# Patient Record
Sex: Male | Born: 1945 | Race: White | Hispanic: No | Marital: Married | Smoking: Former smoker
Health system: Southern US, Community
[De-identification: ages and names within clinical notes are randomized; demographics above are authoritative.]

## PROBLEM LIST (undated history)

## (undated) DIAGNOSIS — K5790 Diverticulosis of intestine, part unspecified, without perforation or abscess without bleeding: Secondary | ICD-10-CM

## (undated) DIAGNOSIS — E785 Hyperlipidemia, unspecified: Secondary | ICD-10-CM

## (undated) DIAGNOSIS — I714 Abdominal aortic aneurysm, without rupture, unspecified: Secondary | ICD-10-CM

## (undated) DIAGNOSIS — Z86010 Personal history of colonic polyps: Principal | ICD-10-CM

## (undated) DIAGNOSIS — N189 Chronic kidney disease, unspecified: Secondary | ICD-10-CM

## (undated) DIAGNOSIS — T7840XA Allergy, unspecified, initial encounter: Secondary | ICD-10-CM

## (undated) DIAGNOSIS — I10 Essential (primary) hypertension: Secondary | ICD-10-CM

## (undated) DIAGNOSIS — M1990 Unspecified osteoarthritis, unspecified site: Secondary | ICD-10-CM

## (undated) DIAGNOSIS — I6529 Occlusion and stenosis of unspecified carotid artery: Secondary | ICD-10-CM

## (undated) HISTORY — DX: Allergy, unspecified, initial encounter: T78.40XA

## (undated) HISTORY — DX: Diverticulosis of intestine, part unspecified, without perforation or abscess without bleeding: K57.90

## (undated) HISTORY — DX: Essential (primary) hypertension: I10

## (undated) HISTORY — DX: Unspecified osteoarthritis, unspecified site: M19.90

## (undated) HISTORY — DX: Personal history of colonic polyps: Z86.010

## (undated) HISTORY — DX: Abdominal aortic aneurysm, without rupture, unspecified: I71.40

## (undated) HISTORY — DX: Chronic kidney disease, unspecified: N18.9

## (undated) HISTORY — DX: Abdominal aortic aneurysm, without rupture: I71.4

## (undated) HISTORY — DX: Occlusion and stenosis of unspecified carotid artery: I65.29

## (undated) HISTORY — DX: Hyperlipidemia, unspecified: E78.5

---
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
## 1999-04-15 HISTORY — PX: CATARACT EXTRACTION: SUR2

## 2000-08-30 ENCOUNTER — Encounter: Payer: Self-pay | Admitting: *Deleted

## 2000-08-30 ENCOUNTER — Emergency Department (HOSPITAL_COMMUNITY): Admission: EM | Admit: 2000-08-30 | Discharge: 2000-08-30 | Payer: Self-pay | Admitting: *Deleted

## 2004-05-28 ENCOUNTER — Ambulatory Visit: Payer: Self-pay | Admitting: Internal Medicine

## 2004-07-19 ENCOUNTER — Ambulatory Visit: Payer: Self-pay | Admitting: Internal Medicine

## 2004-07-30 ENCOUNTER — Ambulatory Visit: Payer: Self-pay | Admitting: Internal Medicine

## 2004-08-16 ENCOUNTER — Ambulatory Visit: Payer: Self-pay | Admitting: Internal Medicine

## 2004-08-28 ENCOUNTER — Ambulatory Visit: Payer: Self-pay | Admitting: Internal Medicine

## 2009-03-13 ENCOUNTER — Emergency Department (HOSPITAL_COMMUNITY): Admission: EM | Admit: 2009-03-13 | Discharge: 2009-03-13 | Payer: Self-pay | Admitting: Emergency Medicine

## 2010-07-17 LAB — DIFFERENTIAL
Basophils Absolute: 0.1 10*3/uL (ref 0.0–0.1)
Basophils Relative: 1 % (ref 0–1)
Eosinophils Absolute: 0.4 10*3/uL (ref 0.0–0.7)
Eosinophils Relative: 3 % (ref 0–5)
Lymphocytes Relative: 21 % (ref 12–46)
Lymphs Abs: 3 10*3/uL (ref 0.7–4.0)
Monocytes Absolute: 1.1 10*3/uL — ABNORMAL HIGH (ref 0.1–1.0)
Monocytes Relative: 8 % (ref 3–12)
Neutro Abs: 9.9 10*3/uL — ABNORMAL HIGH (ref 1.7–7.7)
Neutrophils Relative %: 68 % (ref 43–77)

## 2010-07-17 LAB — BASIC METABOLIC PANEL
BUN: 13 mg/dL (ref 6–23)
CO2: 31 mEq/L (ref 19–32)
Calcium: 9.7 mg/dL (ref 8.4–10.5)
Chloride: 99 mEq/L (ref 96–112)
Creatinine, Ser: 1.09 mg/dL (ref 0.4–1.5)
GFR calc Af Amer: >60 mL/min (ref >60)
GFR calc non Af Amer: >60 mL/min (ref >60)
Glucose, Bld: 106 mg/dL — ABNORMAL HIGH (ref 70–99)
Potassium: 4.3 mEq/L (ref 3.5–5.1)
Sodium: 137 mEq/L (ref 135–145)

## 2010-07-17 LAB — CBC
HCT: 47.2 % (ref 39.0–52.0)
Hemoglobin: 15.9 g/dL (ref 13.0–17.0)
MCHC: 33.8 g/dL (ref 30.0–36.0)
MCV: 91.8 fL (ref 78.0–100.0)
Platelets: 252 10*3/uL (ref 150–400)
RBC: 5.15 MIL/uL (ref 4.22–5.81)
RDW: 13.6 % (ref 11.5–15.5)
WBC: 14.6 10*3/uL — ABNORMAL HIGH (ref 4.0–10.5)

## 2010-07-17 LAB — HEPATIC FUNCTION PANEL
ALT: 15 U/L (ref 0–53)
Alkaline Phosphatase: 48 U/L (ref 39–117)
Bilirubin, Direct: <0.1 mg/dL (ref 0.0–0.3)

## 2011-05-28 ENCOUNTER — Other Ambulatory Visit: Payer: Self-pay | Admitting: Family Medicine

## 2011-05-28 ENCOUNTER — Telehealth: Payer: Self-pay | Admitting: Internal Medicine

## 2011-05-28 DIAGNOSIS — Z136 Encounter for screening for cardiovascular disorders: Secondary | ICD-10-CM

## 2011-05-28 NOTE — Telephone Encounter (Signed)
Transferring GI Care to [facility]

## 2011-06-05 ENCOUNTER — Ambulatory Visit
Admission: RE | Admit: 2011-06-05 | Discharge: 2011-06-05 | Disposition: A | Discharge status: Home / Self Care | Payer: Medicare Other | Source: Ambulatory Visit | Attending: Family Medicine | Admitting: Family Medicine

## 2011-06-05 DIAGNOSIS — Z136 Encounter for screening for cardiovascular disorders: Secondary | ICD-10-CM

## 2012-07-20 ENCOUNTER — Other Ambulatory Visit: Payer: Self-pay | Admitting: Family Medicine

## 2012-07-20 DIAGNOSIS — I714 Abdominal aortic aneurysm, without rupture: Secondary | ICD-10-CM

## 2012-07-29 ENCOUNTER — Ambulatory Visit
Admission: RE | Admit: 2012-07-29 | Discharge: 2012-07-29 | Disposition: A | Discharge status: Home / Self Care | Payer: Medicare Other | Source: Ambulatory Visit | Attending: Family Medicine | Admitting: Family Medicine

## 2012-07-29 DIAGNOSIS — I714 Abdominal aortic aneurysm, without rupture, unspecified: Secondary | ICD-10-CM

## 2014-08-11 ENCOUNTER — Other Ambulatory Visit: Payer: Self-pay | Admitting: Family Medicine

## 2014-08-11 DIAGNOSIS — I714 Abdominal aortic aneurysm, without rupture, unspecified: Secondary | ICD-10-CM

## 2014-10-03 ENCOUNTER — Ambulatory Visit
Admission: RE | Admit: 2014-10-03 | Discharge: 2014-10-03 | Disposition: A | Discharge status: Home / Self Care | Payer: Commercial Managed Care - HMO | Source: Ambulatory Visit | Attending: Family Medicine | Admitting: Family Medicine

## 2014-10-03 DIAGNOSIS — I714 Abdominal aortic aneurysm, without rupture, unspecified: Secondary | ICD-10-CM

## 2014-11-03 ENCOUNTER — Other Ambulatory Visit: Payer: Self-pay | Admitting: Family Medicine

## 2014-11-03 DIAGNOSIS — I714 Abdominal aortic aneurysm, without rupture, unspecified: Secondary | ICD-10-CM

## 2014-11-07 ENCOUNTER — Encounter: Payer: Self-pay | Admitting: Vascular Surgery

## 2014-11-08 ENCOUNTER — Other Ambulatory Visit: Payer: Self-pay | Admitting: Family Medicine

## 2014-11-08 DIAGNOSIS — I714 Abdominal aortic aneurysm, without rupture, unspecified: Secondary | ICD-10-CM

## 2014-12-01 ENCOUNTER — Encounter: Payer: Self-pay | Admitting: Vascular Surgery

## 2014-12-04 ENCOUNTER — Encounter: Payer: Self-pay | Admitting: Vascular Surgery

## 2014-12-05 ENCOUNTER — Encounter: Payer: Self-pay | Admitting: Vascular Surgery

## 2014-12-05 ENCOUNTER — Ambulatory Visit (INDEPENDENT_AMBULATORY_CARE_PROVIDER_SITE_OTHER): Payer: Commercial Managed Care - HMO | Admitting: Vascular Surgery

## 2014-12-05 ENCOUNTER — Encounter (INDEPENDENT_AMBULATORY_CARE_PROVIDER_SITE_OTHER): Payer: Self-pay

## 2014-12-05 VITALS — BP 138/82 | HR 71 | Temp 97.1°F | Resp 16 | Ht 67.0 in | Wt 195.0 lb

## 2014-12-05 DIAGNOSIS — I714 Abdominal aortic aneurysm, without rupture, unspecified: Secondary | ICD-10-CM

## 2014-12-05 NOTE — Progress Notes (Signed)
Subjective:     Patient ID: Adam Shepherd, male   DOB: 1945/08/15, 69 y.o.   MRN: [id number]  HPI this 69 year old male was referred for evaluation of small abdominal aortic aneurysm. In 2012 the aneurysm measured 3.2 cm, in 2014 it was 3.5 cm, and this year 3.7 x 3.5 cm on ultrasound study. He also had a CT scan performed in 2010 at that time the aorta measured 2.7 cm in maximum diameter. He denies any new abdominal or back symptoms. He does have a history of diverticulitis which cause some left lower quadrant discomfort which resolved. He does not smoke and has no known family history of aortic aneurysm because he is an orphan.  Past Medical History  Diagnosis Date  . Hypertension   . Hyperlipidemia   . Allergy   . Diverticulosis   . Chronic kidney disease   . Degenerative joint disease   . AAA (abdominal aortic aneurysm)     Social History  Substance Use Topics  . Smoking status: Former Smoker    Quit date: 11/07/1966  . Smokeless tobacco: Never Used  . Alcohol Use: No    No family history on file.  No Known Allergies   Current outpatient prescriptions:  .  aspirin EC 81 MG tablet, Take 81 mg by mouth daily., Disp: , Rfl:  .  atenolol (TENORMIN) 25 MG tablet, Take 25 mg by mouth daily., Disp: , Rfl:  .  fluticasone (FLONASE) 50 MCG/ACT nasal spray, Place 2 sprays into both nostrils daily as needed for allergies or rhinitis., Disp: , Rfl:   Filed Vitals:   12/05/14 1042  BP: 138/82  Pulse: 71  Temp: 97.1 F (36.2 C)  Resp: 16  Height: 5\' 7"  (1.702 m)  Weight: 195 lb (88.451 kg)  SpO2: 97%    Body mass index is 30.53 kg/(m^2).           Review of Systems denies chest pain, dyspnea on exertion, PND, orthopnea, hemoptysis, claudication. Does have history of degenerative joint disease, diverticulitis. Other systems negative and complete review of systems     Objective:   Physical Exam BP 138/82 mmHg  Pulse 71  Temp(Src) 97.1 F (36.2 C)  Resp 16  Ht 5'  7" (1.702 m)  Wt 195 lb (88.451 kg)  BMI 30.53 kg/m2  SpO2 97%  Gen.-alert and oriented x3 in no apparent distress HEENT normal for age Lungs no rhonchi or wheezing Cardiovascular regular rhythm no murmurs carotid pulses 3+ palpable no bruits audible Abdomen soft nontender no palpable masses Musculoskeletal free of  major deformities Skin clear -no rashes Neurologic normal Lower extremities 3+ femoral and dorsalis pedis pulses palpable bilaterally with no edema  Today I reviewed the ultrasound reports from the past 3 studies in 20 04/02/2013 and 2016 and also viewed the images by computer of the CT scan performed in 2010. As summarized above the aneurysm has grown from 2.7 cm in 2010 by CT scan to its current size of 3.7 x 3.5 by ultrasound. It is asymptomatic.       Assessment:     Abdominal aortic aneurysm-3.5 x 3.7 cm in maximum diameter by ultrasound in June 2016 Degenerative joint disease History of diverticulitis    Plan:     Discussed necessary follow-up with patient and his wife and the fact that no treatment would be recommended until aneurysm exceeds 5 cm in maximum diameter Patient return in 2 years with duplex scan of the aorta and our office and see  nurse practitioner for continued surveillance Patient instructed if he develops abdominal or left flank discomfort she go to the emergency department at which time CT scan could rule out leakage of the aneurysm

## 2014-12-05 NOTE — Addendum Note (Signed)
Addended by: Dorthula Rue L on: 12/05/2014 02:45 PM   Modules accepted: Orders

## 2014-12-06 NOTE — Addendum Note (Signed)
Addended by: Dorthula Rue L on: 12/06/2014 09:56 AM   Modules accepted: Orders

## 2014-12-08 ENCOUNTER — Encounter: Payer: Self-pay | Admitting: Vascular Surgery

## 2015-02-19 ENCOUNTER — Other Ambulatory Visit: Payer: Self-pay | Admitting: Family Medicine

## 2015-02-19 ENCOUNTER — Ambulatory Visit
Admission: RE | Admit: 2015-02-19 | Discharge: 2015-02-19 | Disposition: A | Discharge status: Home / Self Care | Payer: Commercial Managed Care - HMO | Source: Ambulatory Visit | Attending: Family Medicine | Admitting: Family Medicine

## 2015-02-19 DIAGNOSIS — M79606 Pain in leg, unspecified: Secondary | ICD-10-CM

## 2015-08-14 ENCOUNTER — Encounter: Payer: Self-pay | Admitting: Internal Medicine

## 2015-08-14 DIAGNOSIS — I10 Essential (primary) hypertension: Secondary | ICD-10-CM | POA: Diagnosis not present

## 2015-08-14 DIAGNOSIS — Z0000 Encounter for general adult medical examination without abnormal findings: Secondary | ICD-10-CM | POA: Diagnosis not present

## 2015-08-14 DIAGNOSIS — S32020A Wedge compression fracture of second lumbar vertebra, initial encounter for closed fracture: Secondary | ICD-10-CM | POA: Diagnosis not present

## 2015-08-14 DIAGNOSIS — E785 Hyperlipidemia, unspecified: Secondary | ICD-10-CM | POA: Diagnosis not present

## 2015-08-14 DIAGNOSIS — I714 Abdominal aortic aneurysm, without rupture: Secondary | ICD-10-CM | POA: Diagnosis not present

## 2015-08-14 DIAGNOSIS — Z125 Encounter for screening for malignant neoplasm of prostate: Secondary | ICD-10-CM | POA: Diagnosis not present

## 2015-08-14 DIAGNOSIS — Z79899 Other long term (current) drug therapy: Secondary | ICD-10-CM | POA: Diagnosis not present

## 2015-09-11 DIAGNOSIS — M955 Acquired deformity of pelvis: Secondary | ICD-10-CM | POA: Diagnosis not present

## 2015-09-11 DIAGNOSIS — M9905 Segmental and somatic dysfunction of pelvic region: Secondary | ICD-10-CM | POA: Diagnosis not present

## 2015-09-11 DIAGNOSIS — M62830 Muscle spasm of back: Secondary | ICD-10-CM | POA: Diagnosis not present

## 2015-09-11 DIAGNOSIS — M9903 Segmental and somatic dysfunction of lumbar region: Secondary | ICD-10-CM | POA: Diagnosis not present

## 2015-09-14 DIAGNOSIS — M9905 Segmental and somatic dysfunction of pelvic region: Secondary | ICD-10-CM | POA: Diagnosis not present

## 2015-09-14 DIAGNOSIS — M62830 Muscle spasm of back: Secondary | ICD-10-CM | POA: Diagnosis not present

## 2015-09-14 DIAGNOSIS — M955 Acquired deformity of pelvis: Secondary | ICD-10-CM | POA: Diagnosis not present

## 2015-09-14 DIAGNOSIS — M9903 Segmental and somatic dysfunction of lumbar region: Secondary | ICD-10-CM | POA: Diagnosis not present

## 2015-10-05 ENCOUNTER — Ambulatory Visit (AMBULATORY_SURGERY_CENTER): Payer: Self-pay | Admitting: *Deleted

## 2015-10-05 VITALS — Ht 67.0 in | Wt 193.6 lb

## 2015-10-05 DIAGNOSIS — Z1211 Encounter for screening for malignant neoplasm of colon: Secondary | ICD-10-CM

## 2015-10-05 NOTE — Progress Notes (Signed)
No allergies to eggs or soy. No problems with anesthesia.  Pt given Emmi instructions for colonoscopy  No oxygen use  No diet drug use  

## 2015-10-08 DIAGNOSIS — M9903 Segmental and somatic dysfunction of lumbar region: Secondary | ICD-10-CM | POA: Diagnosis not present

## 2015-10-08 DIAGNOSIS — M9905 Segmental and somatic dysfunction of pelvic region: Secondary | ICD-10-CM | POA: Diagnosis not present

## 2015-10-08 DIAGNOSIS — M955 Acquired deformity of pelvis: Secondary | ICD-10-CM | POA: Diagnosis not present

## 2015-10-08 DIAGNOSIS — M62830 Muscle spasm of back: Secondary | ICD-10-CM | POA: Diagnosis not present

## 2015-10-10 ENCOUNTER — Encounter: Payer: Self-pay | Admitting: Internal Medicine

## 2015-10-19 ENCOUNTER — Encounter: Payer: Self-pay | Admitting: Internal Medicine

## 2015-10-19 ENCOUNTER — Ambulatory Visit (AMBULATORY_SURGERY_CENTER): Payer: PPO | Admitting: Internal Medicine

## 2015-10-19 VITALS — BP 110/74 | HR 65 | Temp 98.6°F | Resp 15 | Ht 67.0 in | Wt 193.0 lb

## 2015-10-19 DIAGNOSIS — D120 Benign neoplasm of cecum: Secondary | ICD-10-CM | POA: Diagnosis not present

## 2015-10-19 DIAGNOSIS — I10 Essential (primary) hypertension: Secondary | ICD-10-CM | POA: Diagnosis not present

## 2015-10-19 DIAGNOSIS — D122 Benign neoplasm of ascending colon: Secondary | ICD-10-CM | POA: Diagnosis not present

## 2015-10-19 DIAGNOSIS — Z1211 Encounter for screening for malignant neoplasm of colon: Secondary | ICD-10-CM

## 2015-10-19 DIAGNOSIS — D124 Benign neoplasm of descending colon: Secondary | ICD-10-CM | POA: Diagnosis not present

## 2015-10-19 MED ORDER — SODIUM CHLORIDE 0.9 % IV SOLN: 500.0000 mL | INTRAVENOUS | Status: DC

## 2015-10-19 NOTE — Progress Notes (Signed)
Called to room to assist during endoscopic procedure.  Patient ID and intended procedure confirmed with present staff. Received instructions for my participation in the procedure from the performing physician.  

## 2015-10-19 NOTE — Patient Instructions (Addendum)
I found and removed 3 small polyps that look benign. You also have diverticulosis - thickened muscle rings and pouches in the colon wall. Please read the handout about this condition.  I will let you know pathology results and when to have another routine colonoscopy by mail.  I appreciate the opportunity to care for you. Gatha Mayer, MD, [facility]   Discharge instructions given. Handouts on polyps and diverticulosis. Resume previous medications. YOU HAD AN ENDOSCOPIC PROCEDURE TODAY AT [facility]:   Refer to the procedure report that was given to you for any specific questions about what was found during the examination.  If the procedure report does not answer your questions, please call your gastroenterologist to clarify.  If you requested that your care partner not be given the details of your procedure findings, then the procedure report has been included in a sealed envelope for you to review at your convenience later.  YOU SHOULD EXPECT: Some feelings of bloating in the abdomen. Passage of more gas than usual.  Walking can help get rid of the air that was put into your GI tract during the procedure and reduce the bloating. If you had a lower endoscopy (such as a colonoscopy or flexible sigmoidoscopy) you may notice spotting of blood in your stool or on the toilet paper. If you underwent a bowel prep for your procedure, you may not have a normal bowel movement for a few days.  Please Note:  You might notice some irritation and congestion in your nose or some drainage.  This is from the oxygen used during your procedure.  There is no need for concern and it should clear up in a day or so.  SYMPTOMS TO REPORT IMMEDIATELY:   Following lower endoscopy (colonoscopy or flexible sigmoidoscopy):  Excessive amounts of blood in the stool  Significant tenderness or worsening of abdominal pains  Swelling of the abdomen that is new, acute  Fever of 100F or higher   For  urgent or emergent issues, a gastroenterologist can be reached at any hour by calling [phone].   DIET: Your first meal following the procedure should be a small meal and then it is ok to progress to your normal diet. Heavy or fried foods are harder to digest and may make you feel nauseous or bloated.  Likewise, meals heavy in dairy and vegetables can increase bloating.  Drink plenty of fluids but you should avoid alcoholic beverages for 24 hours.  ACTIVITY:  You should plan to take it easy for the rest of today and you should NOT DRIVE or use heavy machinery until tomorrow (because of the sedation medicines used during the test).    FOLLOW UP: Our staff will call the number listed on your records the next business day following your procedure to check on you and address any questions or concerns that you may have regarding the information given to you following your procedure. If we do not reach you, we will leave a message.  However, if you are feeling well and you are not experiencing any problems, there is no need to return our call.  We will assume that you have returned to your regular daily activities without incident.  If any biopsies were taken you will be contacted by phone or by letter within the next 1-3 weeks.  Please call us at [phone] if you have not heard about the biopsies in 3 weeks.    SIGNATURES/CONFIDENTIALITY: You and/or your care partner  have signed paperwork which will be entered into your electronic medical record.  These signatures attest to the fact that that the information above on your After Visit Summary has been reviewed and is understood.  Full responsibility of the confidentiality of this discharge information lies with you and/or your care-partner.

## 2015-10-19 NOTE — Op Note (Signed)
[facility] Patient Name: Adam Shepherd Procedure Date: 10/19/2015 10:00 AM MRN: [id number] Endoscopist: Gatha Mayer , MD Age: 70 Referring MD:  Date of Birth: 1945-08-19 Gender: Male Account #: [id number] Procedure:                Colonoscopy Indications:              Screening for colorectal malignant neoplasm (last                            colonoscopy was more than 10 years ago) Medicines:                Propofol per Anesthesia, Monitored Anesthesia Care Procedure:                Pre-Anesthesia Assessment:                           - Prior to the procedure, a History and Physical                            was performed, and patient medications and                            allergies were reviewed. The patient's tolerance of                            previous anesthesia was also reviewed. The risks                            and benefits of the procedure and the sedation                            options and risks were discussed with the patient.                            All questions were answered, and informed consent                            was obtained. Prior Anticoagulants: The patient has                            taken no previous anticoagulant or antiplatelet                            agents. ASA Grade Assessment: II - A patient with                            mild systemic disease. After reviewing the risks                            and benefits, the patient was deemed in                            satisfactory condition to undergo the procedure.  After obtaining informed consent, the colonoscope                            was passed under direct vision. Throughout the                            procedure, the patient's blood pressure, pulse, and                            oxygen saturations were monitored continuously. The                            Model CF-HQ190L [id number]) scope was introduced     through the anus and advanced to the the cecum,                            identified by appendiceal orifice and ileocecal                            valve. The colonoscopy was performed without                            difficulty. The patient tolerated the procedure                            well. The quality of the bowel preparation was                            good. The bowel preparation used was Miralax. The                            ileocecal valve, appendiceal orifice, and rectum                            were photographed. Scope In: 10:11:26 AM Scope Out: 10:23:25 AM Scope Withdrawal Time: 0 hours 10 minutes 5 seconds  Total Procedure Duration: 0 hours 11 minutes 59 seconds  Findings:                 The perianal and digital rectal examinations were                            normal. Pertinent negatives include normal prostate                            (size, shape, and consistency).                           Three sessile polyps were found in the descending                            colon, ascending colon and ileocecal valve. The                            polyps were 5 to 7  mm in size. These polyps were                            removed with a cold snare. Resection and retrieval                            were complete. Verification of patient                            identification for the specimen was done. Estimated                            blood loss was minimal.                           Many small and large-mouthed diverticula were found                            in the sigmoid colon and descending colon. There                            was no evidence of diverticular bleeding.                           The exam was otherwise without abnormality on                            direct and retroflexion views. Complications:            No immediate complications. Estimated Blood Loss:     Estimated blood loss was minimal. Impression:               - Three 5 to  7 mm polyps in the descending colon,                            in the ascending colon and at the ileocecal valve,                            removed with a cold snare. Resected and retrieved.                           - Severe diverticulosis in the sigmoid colon and in                            the descending colon. There was no evidence of                            diverticular bleeding.                           - The examination was otherwise normal on direct                            and retroflexion views. Recommendation:           -  Patient has a contact number available for                            emergencies. The signs and symptoms of potential                            delayed complications were discussed with the                            patient. Return to normal activities tomorrow.                            Written discharge instructions were provided to the                            patient.                           - Resume previous diet.                           - Continue present medications.                           - Repeat colonoscopy is recommended. The                            colonoscopy date will be determined after pathology                            results from today's exam become available for                            review. Gatha Mayer, MD 10/19/2015 10:31:18 AM This report has been signed electronically.

## 2015-10-19 NOTE — Progress Notes (Signed)
Report to PACU, RN, vss, BBS= Clear.  

## 2015-10-22 ENCOUNTER — Telehealth: Payer: Self-pay

## 2015-10-22 NOTE — Telephone Encounter (Signed)
Attempt post procedure follow up call, no answer, left voice mail message.  

## 2015-10-29 ENCOUNTER — Encounter: Payer: Self-pay | Admitting: Internal Medicine

## 2015-10-29 DIAGNOSIS — Z86010 Personal history of colonic polyps: Secondary | ICD-10-CM

## 2015-10-29 DIAGNOSIS — Z860101 Personal history of adenomatous and serrated colon polyps: Secondary | ICD-10-CM | POA: Insufficient documentation

## 2015-10-29 HISTORY — DX: Personal history of adenomatous and serrated colon polyps: Z86.0101

## 2015-10-29 HISTORY — DX: Personal history of colonic polyps: Z86.010

## 2015-10-29 NOTE — Progress Notes (Signed)
Quick Note:  3 subcm adenomas recall 2020 ______

## 2015-10-30 DIAGNOSIS — M955 Acquired deformity of pelvis: Secondary | ICD-10-CM | POA: Diagnosis not present

## 2015-10-30 DIAGNOSIS — M62830 Muscle spasm of back: Secondary | ICD-10-CM | POA: Diagnosis not present

## 2015-10-30 DIAGNOSIS — M9905 Segmental and somatic dysfunction of pelvic region: Secondary | ICD-10-CM | POA: Diagnosis not present

## 2015-10-30 DIAGNOSIS — M9903 Segmental and somatic dysfunction of lumbar region: Secondary | ICD-10-CM | POA: Diagnosis not present

## 2015-11-19 DIAGNOSIS — M9903 Segmental and somatic dysfunction of lumbar region: Secondary | ICD-10-CM | POA: Diagnosis not present

## 2015-11-19 DIAGNOSIS — M62830 Muscle spasm of back: Secondary | ICD-10-CM | POA: Diagnosis not present

## 2015-11-19 DIAGNOSIS — M9905 Segmental and somatic dysfunction of pelvic region: Secondary | ICD-10-CM | POA: Diagnosis not present

## 2015-11-19 DIAGNOSIS — M955 Acquired deformity of pelvis: Secondary | ICD-10-CM | POA: Diagnosis not present

## 2015-11-26 DIAGNOSIS — R21 Rash and other nonspecific skin eruption: Secondary | ICD-10-CM | POA: Diagnosis not present

## 2015-12-03 DIAGNOSIS — M62830 Muscle spasm of back: Secondary | ICD-10-CM | POA: Diagnosis not present

## 2015-12-03 DIAGNOSIS — M955 Acquired deformity of pelvis: Secondary | ICD-10-CM | POA: Diagnosis not present

## 2015-12-03 DIAGNOSIS — M9903 Segmental and somatic dysfunction of lumbar region: Secondary | ICD-10-CM | POA: Diagnosis not present

## 2015-12-03 DIAGNOSIS — M9905 Segmental and somatic dysfunction of pelvic region: Secondary | ICD-10-CM | POA: Diagnosis not present

## 2016-01-29 DIAGNOSIS — L219 Seborrheic dermatitis, unspecified: Secondary | ICD-10-CM | POA: Diagnosis not present

## 2016-01-29 DIAGNOSIS — B354 Tinea corporis: Secondary | ICD-10-CM | POA: Diagnosis not present

## 2016-03-11 DIAGNOSIS — L218 Other seborrheic dermatitis: Secondary | ICD-10-CM | POA: Diagnosis not present

## 2016-03-11 DIAGNOSIS — L400 Psoriasis vulgaris: Secondary | ICD-10-CM | POA: Diagnosis not present

## 2016-03-11 DIAGNOSIS — D1801 Hemangioma of skin and subcutaneous tissue: Secondary | ICD-10-CM | POA: Diagnosis not present

## 2016-03-11 DIAGNOSIS — B354 Tinea corporis: Secondary | ICD-10-CM | POA: Diagnosis not present

## 2016-03-11 DIAGNOSIS — L821 Other seborrheic keratosis: Secondary | ICD-10-CM | POA: Diagnosis not present

## 2016-04-02 DIAGNOSIS — M62830 Muscle spasm of back: Secondary | ICD-10-CM | POA: Diagnosis not present

## 2016-04-02 DIAGNOSIS — M9905 Segmental and somatic dysfunction of pelvic region: Secondary | ICD-10-CM | POA: Diagnosis not present

## 2016-04-02 DIAGNOSIS — M955 Acquired deformity of pelvis: Secondary | ICD-10-CM | POA: Diagnosis not present

## 2016-04-02 DIAGNOSIS — M9903 Segmental and somatic dysfunction of lumbar region: Secondary | ICD-10-CM | POA: Diagnosis not present

## 2016-08-25 DIAGNOSIS — Z79899 Other long term (current) drug therapy: Secondary | ICD-10-CM | POA: Diagnosis not present

## 2016-08-25 DIAGNOSIS — E785 Hyperlipidemia, unspecified: Secondary | ICD-10-CM | POA: Diagnosis not present

## 2016-08-25 DIAGNOSIS — I10 Essential (primary) hypertension: Secondary | ICD-10-CM | POA: Diagnosis not present

## 2016-08-25 DIAGNOSIS — Z125 Encounter for screening for malignant neoplasm of prostate: Secondary | ICD-10-CM | POA: Diagnosis not present

## 2016-08-25 DIAGNOSIS — Z6831 Body mass index (BMI) 31.0-31.9, adult: Secondary | ICD-10-CM | POA: Diagnosis not present

## 2016-08-25 DIAGNOSIS — Z0000 Encounter for general adult medical examination without abnormal findings: Secondary | ICD-10-CM | POA: Diagnosis not present

## 2016-08-25 DIAGNOSIS — I714 Abdominal aortic aneurysm, without rupture: Secondary | ICD-10-CM | POA: Diagnosis not present

## 2016-08-30 IMAGING — CR DG LUMBAR SPINE COMPLETE 4+V
5 series · 5 of 5 positions shown · non-contrast
Comparison: Comparison made to prior CT study of 03/13/2009 .

CLINICAL DATA: Leg pain.  No known injury.  Initial evaluation.

EXAM:
LUMBAR SPINE - COMPLETE 4+ VIEW

[t l-spine a.p.]
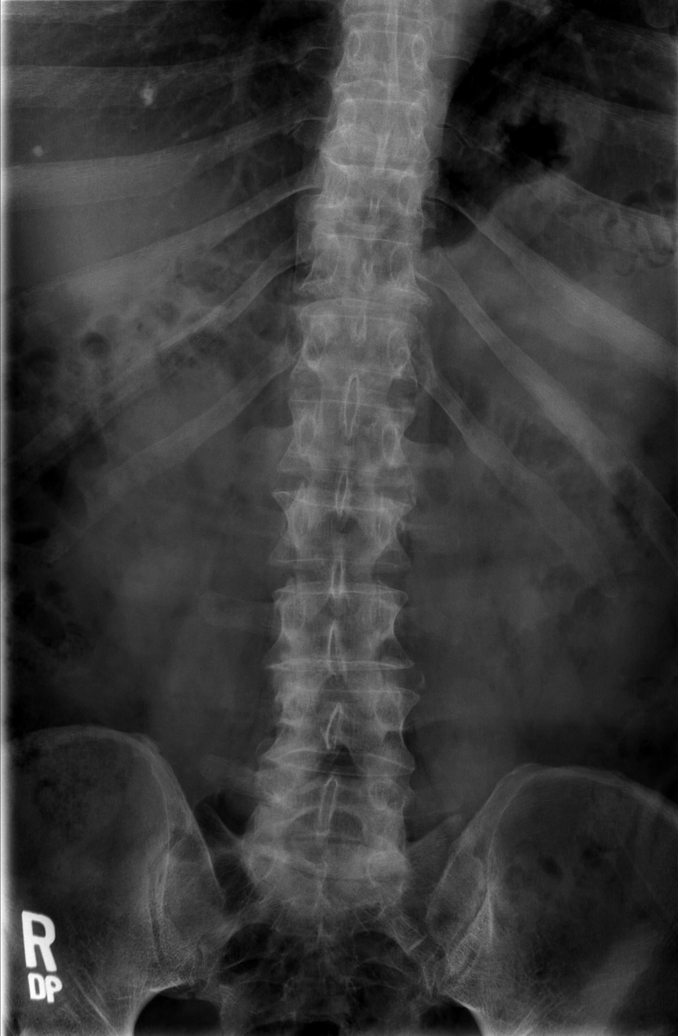

[t l-spine oblique exposure (1 of 2)]
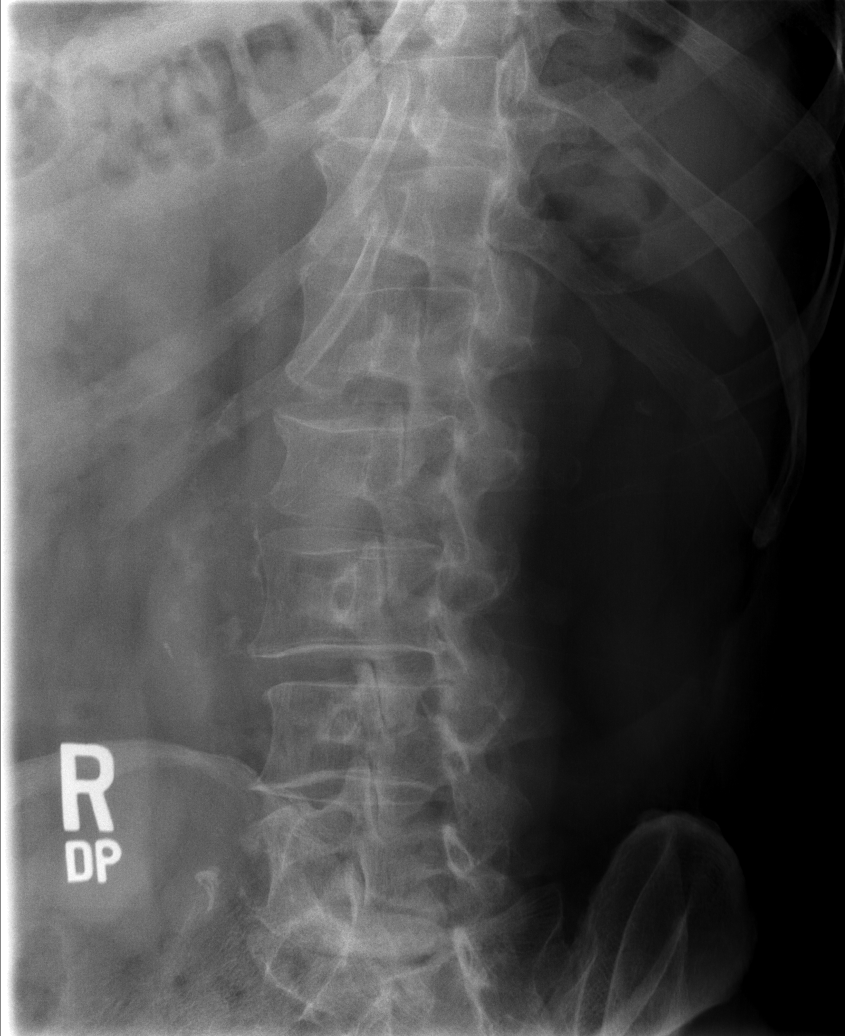

[t l-spine oblique exposure (2 of 2)]
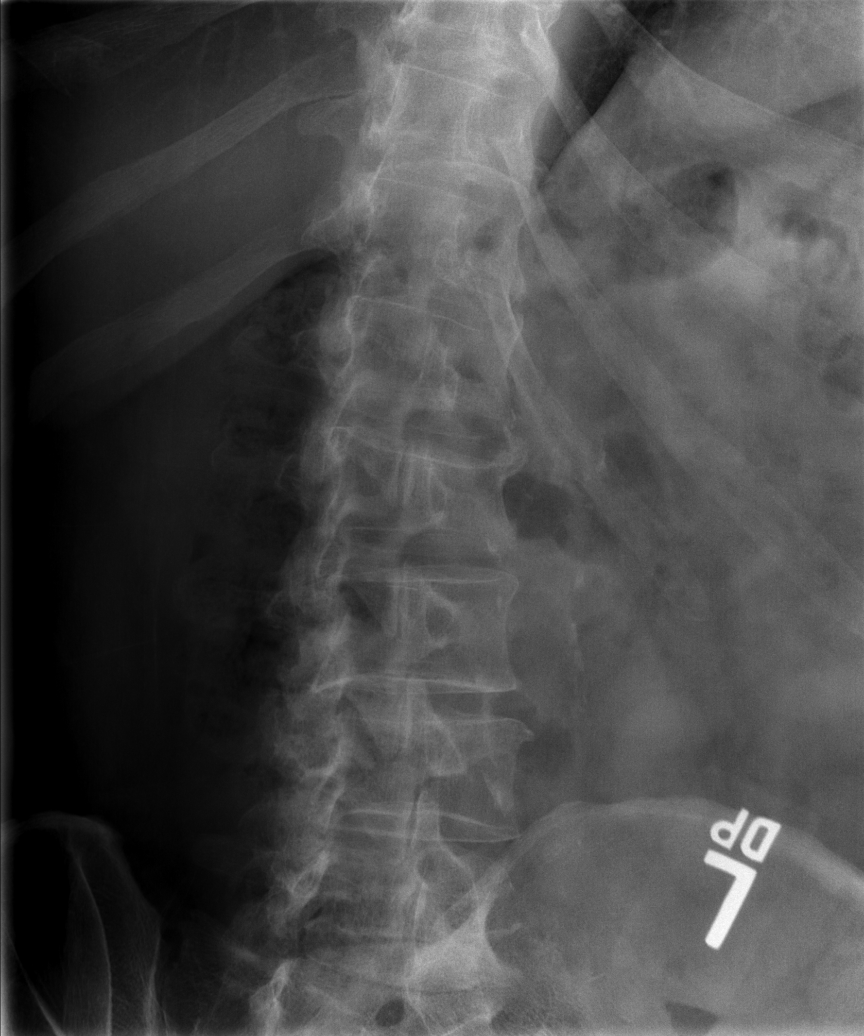

[t l-spine lat]
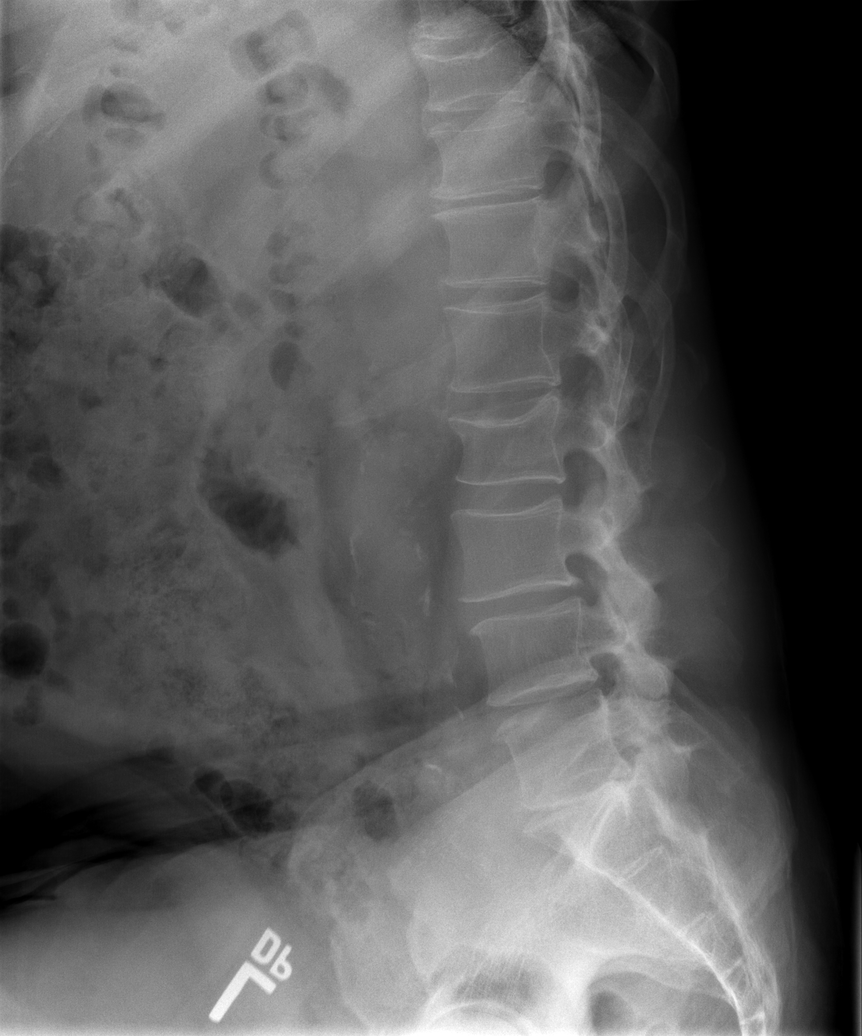

[t l-spine l5-s1 spot]
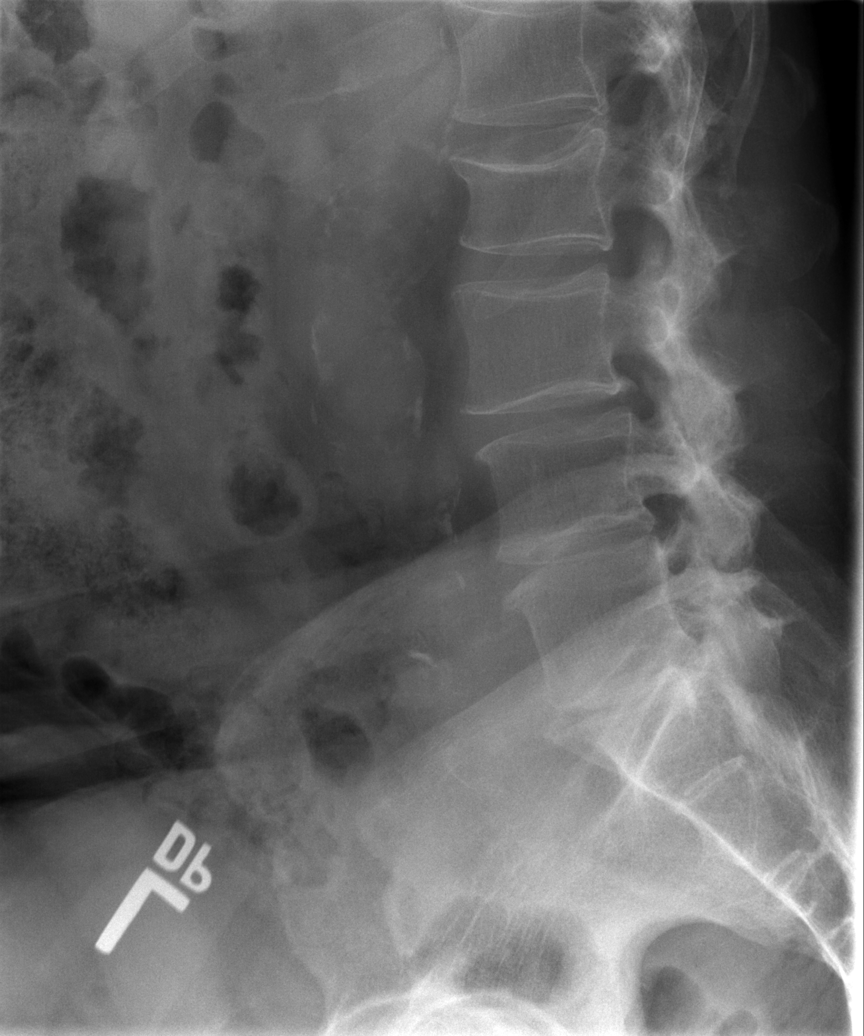

[5 of 5 positions shown; findings below may reference images not displayed]

FINDINGS: New a mild L2 compression fractures noted. This is new from prior CT
of 03/13/2009. Diffuse multilevel degenerative change. Normal
alignment. Abdominal aortic ectasia. Abdominal aortic ultrasound
suggested for further evaluation to exclude abdominal aortic
aneurysm .
IMPRESSION: 1. New mild L2 compression fracture. This is new from prior CT of
03/13/2009.

[DATE].  Diffuse multilevel degenerative change .

3. Abdominal aortic ectasia. Abdominal aortic ultrasound suggested
to exclude abdominal aortic aneurysm.

## 2016-11-28 ENCOUNTER — Encounter: Payer: Self-pay | Admitting: Family

## 2016-12-09 ENCOUNTER — Encounter: Payer: Self-pay | Admitting: Family

## 2016-12-09 ENCOUNTER — Ambulatory Visit (HOSPITAL_COMMUNITY)
Admission: RE | Admit: 2016-12-09 | Discharge: 2016-12-09 | Disposition: A | Discharge status: Home / Self Care | Payer: PPO | Source: Ambulatory Visit | Attending: Vascular Surgery | Admitting: Vascular Surgery

## 2016-12-09 ENCOUNTER — Ambulatory Visit (INDEPENDENT_AMBULATORY_CARE_PROVIDER_SITE_OTHER): Payer: PPO | Admitting: Family

## 2016-12-09 VITALS — BP 131/77 | HR 74 | Temp 97.2°F | Ht 68.0 in | Wt 196.0 lb

## 2016-12-09 DIAGNOSIS — I714 Abdominal aortic aneurysm, without rupture, unspecified: Secondary | ICD-10-CM

## 2016-12-09 NOTE — Patient Instructions (Signed)
Abdominal Aortic Aneurysm Blood pumps away from the heart through tubes (blood vessels) called arteries. Aneurysms are weak or damaged places in the wall of an artery. It bulges out like a balloon. An abdominal aortic aneurysm happens in the main artery of the body (aorta). It can burst or tear, causing bleeding inside the body. This is an emergency. It needs treatment right away. What are the causes? The exact cause is unknown. Things that could cause this problem include:  Fat and other substances building up in the lining of a tube.  Swelling of the walls of a blood vessel.  Certain tissue diseases.  Belly (abdominal) trauma.  An infection in the main artery of the body.  What increases the risk? There are things that make it more likely for you to have an aneurysm. These include:  Being over the age of 71 years old.  Having high blood pressure (hypertension).  Being a male.  Being white.  Being very overweight (obese).  Having a family history of aneurysm.  Using tobacco products.  What are the signs or symptoms? Symptoms depend on the size of the aneurysm and how fast it grows. There may not be symptoms. If symptoms occur, they can include:  Pain (belly, side, lower back, or groin).  Feeling full after eating a small amount of food.  Feeling sick to your stomach (nauseous), throwing up (vomiting), or both.  Feeling a lump in your belly that feels like it is beating (pulsating).  Feeling like you will pass out (faint).  How is this treated?  Medicine to control blood pressure and pain.  Imaging tests to see if the aneurysm gets bigger.  Surgery. How is this prevented? To lessen your chance of getting this condition:  Stop smoking. Stop chewing tobacco.  Limit or avoid alcohol.  Keep your blood pressure, blood sugar, and cholesterol within normal limits.  Eat less salt.  Eat foods low in saturated fats and cholesterol. These are found in animal and  whole dairy products.  Eat more fiber. Fiber is found in whole grains, vegetables, and fruits.  Keep a healthy weight.  Stay active and exercise often.  This information is not intended to replace advice given to you by your health care provider. Make sure you discuss any questions you have with your health care provider. Document Released: 07/26/2012 Document Revised: 09/06/2015 Document Reviewed: 04/30/2012 Elsevier Interactive Patient Education  2017 Elsevier Inc.  

## 2016-12-09 NOTE — Progress Notes (Signed)
[facility] [place]   CC: Follow up Abdominal Aortic Aneurysm  History of Present Illness  Adam Shepherd is a 71 y.o. (1945-06-21) male who was referred to Dr. Kellie Simmering for evaluation of small abdominal aortic aneurysm. In 2012 the aneurysm measured 3.2 cm, in 2014 it was 3.5 cm, and in 2016 was 3.7 x 3.5 cm on ultrasound study. He also had a CT scan performed in 2010 at that time the aorta measured 2.7 cm in maximum diameter.  He denies any new abdominal or back symptoms.  He does have a history of diverticulitis which cause some left lower quadrant discomfort which resolved. He does not smoke and has no known family history of aortic aneurysm because he is an orphan.  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  He wears knee high compression hose.   The patient denies claudication in legs with walking. The patient denies history of stroke or TIA symptoms.  Pt Diabetic: No Pt smoker: former smoker, quit in 1968   Past Medical History:  Diagnosis Date  . AAA (abdominal aortic aneurysm) ([place])   . Allergy   . Chronic kidney disease   . Degenerative joint disease   . Diverticulosis   . Hx of adenomatous colonic polyps 10/29/2015  . Hyperlipidemia   . Hypertension    Past Surgical History:  Procedure Laterality Date  . CATARACT EXTRACTION Bilateral 2001  . FINGER SURGERY Bilateral 1965   after traumatic injury  . TONSILLECTOMY  44   Social History Social History   Social History  . Marital status: Married    Spouse name: N/A  . Number of children: N/A  . Years of education: N/A   Occupational History  . Not on file.   Social History Main Topics  . Smoking status: Former Smoker    Quit date: 11/07/1966  . Smokeless tobacco: Never Used  . Alcohol use No  . Drug use: No  . Sexual activity: Not on file   Other Topics Concern  . Not on file   Social History Narrative   . No narrative on file   Family History Family History  Problem Relation Age of Onset  . Family history unknown: Yes    Current Outpatient Prescriptions on File Prior to Visit  Medication Sig Dispense Refill  . aspirin EC 81 MG tablet Take 81 mg by mouth daily.    Marland Kitchen atenolol (TENORMIN) 25 MG tablet Take 25 mg by mouth daily.    . clotrimazole-betamethasone (LOTRISONE) cream Apply 1 application topically 2 (two) times daily. Reported on 10/05/2015    . fluticasone (FLONASE) 50 MCG/ACT nasal spray Place 2 sprays into both nostrils daily as needed for allergies or rhinitis. Reported on 10/05/2015     No current facility-administered medications on file prior to visit.    No Known Allergies  ROS: See HPI for pertinent positives and negatives.  Physical Examination  Vitals:   12/09/16 0925 12/09/16 0927  BP: (!) 141/77 131/77  Pulse: 74   Temp: (!) 97.2 F (36.2 C)   TempSrc: Oral   SpO2: 96%   Weight: 196 lb (88.9 kg)   Height: 5\' 8"  (1.727 m)    Body mass index is 29.8 kg/m.  General: A&O x 3, WD, male.  Pulmonary: Sym exp, respirations are non labored, good air movt, CTAB, no rales, rhonchi, or wheezing.  Cardiac: RRR, Nl S1,  S2, no appreciable murmur.   Carotid Bruits Right Left   Negative Negative    Adominal aortic pulse is not palpable Radial pulses are 2+ palpable bilaterally                          VASCULAR EXAM:                                                                                                         LE Pulses Right Left       FEMORAL  2+ palpable  2+ palpable        POPLITEAL  2+ palpable   2+ palpable       POSTERIOR TIBIAL  2+ palpable   2+ palpable        DORSALIS PEDIS      ANTERIOR TIBIAL 2+ palpable  2+ palpable      Gastrointestinal: soft, NTND, -G/R, - HSM, - masses palpated, - CVAT B.  Musculoskeletal: M/S 5/5 throughout, Extremities without ischemic changes.  Neurologic: CN 2-12 intact, Pain and light touch intact in  extremities are intact, Motor exam as listed above.  Non-Invasive Vascular Imaging  AAA Duplex (12/09/2016)  Previous size: 3.7 cm (Date: 10-03-14, at another facility)  Current size:  3.8 cm (Date: 12-09-16); Right CIA: 1.2 cm; Left CIA: 1.4 cm  Medical Decision Making  The patient is a 71 y.o. male who presents with asymptomatic small AAA with no significant increase in size.   Based on this patient's exam and diagnostic studies, the patient will follow up in 2 years  with the following studies: AAA duplex.  Consideration for repair of AAA would be made when the size is 5.0 cm, growth > 1 cm/yr, and symptomatic status.        The patient was given information about AAA including signs, symptoms, treatment, and how to minimize the risk of enlargement and rupture of aneurysms.    I emphasized the importance of maximal medical management including strict control of blood pressure, blood glucose, and lipid levels, antiplatelet agents, obtaining regular exercise, and continued cessation of smoking.   The patient was advised to call 911 should the patient experience sudden onset abdominal or back pain.   Thank you for allowing [place] to participate in this patient's care.  Clemon Chambers, RN, MSN, FNP-C [facility] [place] Office: [phone]  Clinic Physician: Scot Dock on call  12/09/2016, 9:47 AM

## 2016-12-17 NOTE — Addendum Note (Signed)
Addended by: Lianne Cure A on: 12/17/2016 01:05 PM   Modules accepted: Orders

## 2017-02-19 ENCOUNTER — Other Ambulatory Visit: Payer: Self-pay | Admitting: Family Medicine

## 2017-03-30 DIAGNOSIS — M9905 Segmental and somatic dysfunction of pelvic region: Secondary | ICD-10-CM | POA: Diagnosis not present

## 2017-03-30 DIAGNOSIS — M9903 Segmental and somatic dysfunction of lumbar region: Secondary | ICD-10-CM | POA: Diagnosis not present

## 2017-03-30 DIAGNOSIS — M62830 Muscle spasm of back: Secondary | ICD-10-CM | POA: Diagnosis not present

## 2017-03-30 DIAGNOSIS — M955 Acquired deformity of pelvis: Secondary | ICD-10-CM | POA: Diagnosis not present

## 2017-04-01 DIAGNOSIS — M9903 Segmental and somatic dysfunction of lumbar region: Secondary | ICD-10-CM | POA: Diagnosis not present

## 2017-04-01 DIAGNOSIS — M62830 Muscle spasm of back: Secondary | ICD-10-CM | POA: Diagnosis not present

## 2017-04-01 DIAGNOSIS — M955 Acquired deformity of pelvis: Secondary | ICD-10-CM | POA: Diagnosis not present

## 2017-04-01 DIAGNOSIS — M9905 Segmental and somatic dysfunction of pelvic region: Secondary | ICD-10-CM | POA: Diagnosis not present

## 2017-04-06 DIAGNOSIS — M9903 Segmental and somatic dysfunction of lumbar region: Secondary | ICD-10-CM | POA: Diagnosis not present

## 2017-04-06 DIAGNOSIS — M9905 Segmental and somatic dysfunction of pelvic region: Secondary | ICD-10-CM | POA: Diagnosis not present

## 2017-04-06 DIAGNOSIS — M62830 Muscle spasm of back: Secondary | ICD-10-CM | POA: Diagnosis not present

## 2017-04-06 DIAGNOSIS — M955 Acquired deformity of pelvis: Secondary | ICD-10-CM | POA: Diagnosis not present

## 2017-04-10 DIAGNOSIS — M62830 Muscle spasm of back: Secondary | ICD-10-CM | POA: Diagnosis not present

## 2017-04-10 DIAGNOSIS — M9903 Segmental and somatic dysfunction of lumbar region: Secondary | ICD-10-CM | POA: Diagnosis not present

## 2017-04-10 DIAGNOSIS — M9905 Segmental and somatic dysfunction of pelvic region: Secondary | ICD-10-CM | POA: Diagnosis not present

## 2017-04-10 DIAGNOSIS — M955 Acquired deformity of pelvis: Secondary | ICD-10-CM | POA: Diagnosis not present

## 2017-04-13 DIAGNOSIS — M9903 Segmental and somatic dysfunction of lumbar region: Secondary | ICD-10-CM | POA: Diagnosis not present

## 2017-04-13 DIAGNOSIS — M9905 Segmental and somatic dysfunction of pelvic region: Secondary | ICD-10-CM | POA: Diagnosis not present

## 2017-04-13 DIAGNOSIS — M62830 Muscle spasm of back: Secondary | ICD-10-CM | POA: Diagnosis not present

## 2017-04-13 DIAGNOSIS — M955 Acquired deformity of pelvis: Secondary | ICD-10-CM | POA: Diagnosis not present

## 2017-04-16 DIAGNOSIS — M9905 Segmental and somatic dysfunction of pelvic region: Secondary | ICD-10-CM | POA: Diagnosis not present

## 2017-04-16 DIAGNOSIS — M955 Acquired deformity of pelvis: Secondary | ICD-10-CM | POA: Diagnosis not present

## 2017-04-16 DIAGNOSIS — M9903 Segmental and somatic dysfunction of lumbar region: Secondary | ICD-10-CM | POA: Diagnosis not present

## 2017-04-16 DIAGNOSIS — M62830 Muscle spasm of back: Secondary | ICD-10-CM | POA: Diagnosis not present

## 2017-04-23 DIAGNOSIS — M9905 Segmental and somatic dysfunction of pelvic region: Secondary | ICD-10-CM | POA: Diagnosis not present

## 2017-04-23 DIAGNOSIS — M955 Acquired deformity of pelvis: Secondary | ICD-10-CM | POA: Diagnosis not present

## 2017-04-23 DIAGNOSIS — M9903 Segmental and somatic dysfunction of lumbar region: Secondary | ICD-10-CM | POA: Diagnosis not present

## 2017-04-23 DIAGNOSIS — M62830 Muscle spasm of back: Secondary | ICD-10-CM | POA: Diagnosis not present

## 2017-11-09 DIAGNOSIS — E785 Hyperlipidemia, unspecified: Secondary | ICD-10-CM | POA: Diagnosis not present

## 2017-11-09 DIAGNOSIS — Z79899 Other long term (current) drug therapy: Secondary | ICD-10-CM | POA: Diagnosis not present

## 2017-11-09 DIAGNOSIS — R140 Abdominal distension (gaseous): Secondary | ICD-10-CM | POA: Diagnosis not present

## 2017-11-09 DIAGNOSIS — I10 Essential (primary) hypertension: Secondary | ICD-10-CM | POA: Diagnosis not present

## 2017-11-09 DIAGNOSIS — K760 Fatty (change of) liver, not elsewhere classified: Secondary | ICD-10-CM | POA: Diagnosis not present

## 2017-11-09 DIAGNOSIS — S32020A Wedge compression fracture of second lumbar vertebra, initial encounter for closed fracture: Secondary | ICD-10-CM | POA: Diagnosis not present

## 2017-11-09 DIAGNOSIS — R7301 Impaired fasting glucose: Secondary | ICD-10-CM | POA: Diagnosis not present

## 2017-11-09 DIAGNOSIS — M5134 Other intervertebral disc degeneration, thoracic region: Secondary | ICD-10-CM | POA: Diagnosis not present

## 2017-11-09 DIAGNOSIS — I714 Abdominal aortic aneurysm, without rupture: Secondary | ICD-10-CM | POA: Diagnosis not present

## 2017-11-09 DIAGNOSIS — S22000A Wedge compression fracture of unspecified thoracic vertebra, initial encounter for closed fracture: Secondary | ICD-10-CM | POA: Diagnosis not present

## 2017-11-09 DIAGNOSIS — Z125 Encounter for screening for malignant neoplasm of prostate: Secondary | ICD-10-CM | POA: Diagnosis not present

## 2017-11-09 DIAGNOSIS — Z0000 Encounter for general adult medical examination without abnormal findings: Secondary | ICD-10-CM | POA: Diagnosis not present

## 2017-12-09 DIAGNOSIS — M955 Acquired deformity of pelvis: Secondary | ICD-10-CM | POA: Diagnosis not present

## 2017-12-09 DIAGNOSIS — M62830 Muscle spasm of back: Secondary | ICD-10-CM | POA: Diagnosis not present

## 2017-12-09 DIAGNOSIS — M9903 Segmental and somatic dysfunction of lumbar region: Secondary | ICD-10-CM | POA: Diagnosis not present

## 2017-12-09 DIAGNOSIS — M9905 Segmental and somatic dysfunction of pelvic region: Secondary | ICD-10-CM | POA: Diagnosis not present

## 2017-12-16 DIAGNOSIS — M955 Acquired deformity of pelvis: Secondary | ICD-10-CM | POA: Diagnosis not present

## 2017-12-16 DIAGNOSIS — M9905 Segmental and somatic dysfunction of pelvic region: Secondary | ICD-10-CM | POA: Diagnosis not present

## 2017-12-16 DIAGNOSIS — M9903 Segmental and somatic dysfunction of lumbar region: Secondary | ICD-10-CM | POA: Diagnosis not present

## 2017-12-16 DIAGNOSIS — M62830 Muscle spasm of back: Secondary | ICD-10-CM | POA: Diagnosis not present

## 2017-12-21 DIAGNOSIS — M9903 Segmental and somatic dysfunction of lumbar region: Secondary | ICD-10-CM | POA: Diagnosis not present

## 2017-12-21 DIAGNOSIS — M9905 Segmental and somatic dysfunction of pelvic region: Secondary | ICD-10-CM | POA: Diagnosis not present

## 2017-12-21 DIAGNOSIS — M955 Acquired deformity of pelvis: Secondary | ICD-10-CM | POA: Diagnosis not present

## 2017-12-21 DIAGNOSIS — M62830 Muscle spasm of back: Secondary | ICD-10-CM | POA: Diagnosis not present

## 2017-12-23 DIAGNOSIS — M9905 Segmental and somatic dysfunction of pelvic region: Secondary | ICD-10-CM | POA: Diagnosis not present

## 2017-12-23 DIAGNOSIS — M9903 Segmental and somatic dysfunction of lumbar region: Secondary | ICD-10-CM | POA: Diagnosis not present

## 2017-12-23 DIAGNOSIS — M955 Acquired deformity of pelvis: Secondary | ICD-10-CM | POA: Diagnosis not present

## 2017-12-23 DIAGNOSIS — M62830 Muscle spasm of back: Secondary | ICD-10-CM | POA: Diagnosis not present

## 2018-01-04 DIAGNOSIS — R1032 Left lower quadrant pain: Secondary | ICD-10-CM | POA: Diagnosis not present

## 2018-01-05 DIAGNOSIS — M9903 Segmental and somatic dysfunction of lumbar region: Secondary | ICD-10-CM | POA: Diagnosis not present

## 2018-01-05 DIAGNOSIS — M955 Acquired deformity of pelvis: Secondary | ICD-10-CM | POA: Diagnosis not present

## 2018-01-05 DIAGNOSIS — M62830 Muscle spasm of back: Secondary | ICD-10-CM | POA: Diagnosis not present

## 2018-01-05 DIAGNOSIS — M9905 Segmental and somatic dysfunction of pelvic region: Secondary | ICD-10-CM | POA: Diagnosis not present

## 2018-01-19 DIAGNOSIS — M62830 Muscle spasm of back: Secondary | ICD-10-CM | POA: Diagnosis not present

## 2018-01-19 DIAGNOSIS — M955 Acquired deformity of pelvis: Secondary | ICD-10-CM | POA: Diagnosis not present

## 2018-01-19 DIAGNOSIS — M9905 Segmental and somatic dysfunction of pelvic region: Secondary | ICD-10-CM | POA: Diagnosis not present

## 2018-01-19 DIAGNOSIS — M9903 Segmental and somatic dysfunction of lumbar region: Secondary | ICD-10-CM | POA: Diagnosis not present

## 2018-02-09 DIAGNOSIS — M9903 Segmental and somatic dysfunction of lumbar region: Secondary | ICD-10-CM | POA: Diagnosis not present

## 2018-02-09 DIAGNOSIS — M9905 Segmental and somatic dysfunction of pelvic region: Secondary | ICD-10-CM | POA: Diagnosis not present

## 2018-02-09 DIAGNOSIS — M62830 Muscle spasm of back: Secondary | ICD-10-CM | POA: Diagnosis not present

## 2018-02-09 DIAGNOSIS — M955 Acquired deformity of pelvis: Secondary | ICD-10-CM | POA: Diagnosis not present

## 2018-05-26 DIAGNOSIS — M9905 Segmental and somatic dysfunction of pelvic region: Secondary | ICD-10-CM | POA: Diagnosis not present

## 2018-05-26 DIAGNOSIS — M955 Acquired deformity of pelvis: Secondary | ICD-10-CM | POA: Diagnosis not present

## 2018-05-26 DIAGNOSIS — M62830 Muscle spasm of back: Secondary | ICD-10-CM | POA: Diagnosis not present

## 2018-05-26 DIAGNOSIS — M9903 Segmental and somatic dysfunction of lumbar region: Secondary | ICD-10-CM | POA: Diagnosis not present

## 2018-06-08 DIAGNOSIS — M9905 Segmental and somatic dysfunction of pelvic region: Secondary | ICD-10-CM | POA: Diagnosis not present

## 2018-06-08 DIAGNOSIS — M62830 Muscle spasm of back: Secondary | ICD-10-CM | POA: Diagnosis not present

## 2018-06-08 DIAGNOSIS — M955 Acquired deformity of pelvis: Secondary | ICD-10-CM | POA: Diagnosis not present

## 2018-06-08 DIAGNOSIS — M9903 Segmental and somatic dysfunction of lumbar region: Secondary | ICD-10-CM | POA: Diagnosis not present

## 2018-09-15 DIAGNOSIS — M9905 Segmental and somatic dysfunction of pelvic region: Secondary | ICD-10-CM | POA: Diagnosis not present

## 2018-09-15 DIAGNOSIS — M62830 Muscle spasm of back: Secondary | ICD-10-CM | POA: Diagnosis not present

## 2018-09-15 DIAGNOSIS — M9903 Segmental and somatic dysfunction of lumbar region: Secondary | ICD-10-CM | POA: Diagnosis not present

## 2018-09-15 DIAGNOSIS — M955 Acquired deformity of pelvis: Secondary | ICD-10-CM | POA: Diagnosis not present

## 2018-10-13 DIAGNOSIS — M62830 Muscle spasm of back: Secondary | ICD-10-CM | POA: Diagnosis not present

## 2018-10-13 DIAGNOSIS — M955 Acquired deformity of pelvis: Secondary | ICD-10-CM | POA: Diagnosis not present

## 2018-10-13 DIAGNOSIS — M9903 Segmental and somatic dysfunction of lumbar region: Secondary | ICD-10-CM | POA: Diagnosis not present

## 2018-10-13 DIAGNOSIS — M9905 Segmental and somatic dysfunction of pelvic region: Secondary | ICD-10-CM | POA: Diagnosis not present

## 2018-11-24 DIAGNOSIS — M9903 Segmental and somatic dysfunction of lumbar region: Secondary | ICD-10-CM | POA: Diagnosis not present

## 2018-11-24 DIAGNOSIS — M9905 Segmental and somatic dysfunction of pelvic region: Secondary | ICD-10-CM | POA: Diagnosis not present

## 2018-11-24 DIAGNOSIS — M62830 Muscle spasm of back: Secondary | ICD-10-CM | POA: Diagnosis not present

## 2018-11-24 DIAGNOSIS — M955 Acquired deformity of pelvis: Secondary | ICD-10-CM | POA: Diagnosis not present

## 2018-12-12 DIAGNOSIS — M79641 Pain in right hand: Secondary | ICD-10-CM | POA: Diagnosis not present

## 2018-12-12 DIAGNOSIS — S02113A Unspecified occipital condyle fracture, initial encounter for closed fracture: Secondary | ICD-10-CM | POA: Diagnosis not present

## 2018-12-12 DIAGNOSIS — J45909 Unspecified asthma, uncomplicated: Secondary | ICD-10-CM | POA: Diagnosis not present

## 2018-12-12 DIAGNOSIS — J9383 Other pneumothorax: Secondary | ICD-10-CM | POA: Diagnosis not present

## 2018-12-12 DIAGNOSIS — D649 Anemia, unspecified: Secondary | ICD-10-CM | POA: Diagnosis not present

## 2018-12-12 DIAGNOSIS — Y998 Other external cause status: Secondary | ICD-10-CM | POA: Diagnosis not present

## 2018-12-12 DIAGNOSIS — S2242XA Multiple fractures of ribs, left side, initial encounter for closed fracture: Secondary | ICD-10-CM | POA: Diagnosis not present

## 2018-12-12 DIAGNOSIS — Y92410 Unspecified street and highway as the place of occurrence of the external cause: Secondary | ICD-10-CM | POA: Diagnosis not present

## 2018-12-12 DIAGNOSIS — M5137 Other intervertebral disc degeneration, lumbosacral region: Secondary | ICD-10-CM | POA: Diagnosis not present

## 2018-12-12 DIAGNOSIS — S065X0A Traumatic subdural hemorrhage without loss of consciousness, initial encounter: Secondary | ICD-10-CM | POA: Diagnosis not present

## 2018-12-12 DIAGNOSIS — J939 Pneumothorax, unspecified: Secondary | ICD-10-CM | POA: Diagnosis not present

## 2018-12-12 DIAGNOSIS — M47817 Spondylosis without myelopathy or radiculopathy, lumbosacral region: Secondary | ICD-10-CM | POA: Diagnosis not present

## 2018-12-12 DIAGNOSIS — S0990XA Unspecified injury of head, initial encounter: Secondary | ICD-10-CM | POA: Diagnosis not present

## 2018-12-12 DIAGNOSIS — Z87891 Personal history of nicotine dependence: Secondary | ICD-10-CM | POA: Diagnosis not present

## 2018-12-12 DIAGNOSIS — S270XXA Traumatic pneumothorax, initial encounter: Secondary | ICD-10-CM | POA: Diagnosis not present

## 2018-12-12 DIAGNOSIS — S0211AA Type I occipital condyle fracture, right side, initial encounter for closed fracture: Secondary | ICD-10-CM | POA: Diagnosis not present

## 2018-12-12 DIAGNOSIS — Z041 Encounter for examination and observation following transport accident: Secondary | ICD-10-CM | POA: Diagnosis not present

## 2018-12-12 DIAGNOSIS — R9431 Abnormal electrocardiogram [ECG] [EKG]: Secondary | ICD-10-CM | POA: Diagnosis not present

## 2018-12-12 DIAGNOSIS — M4856XA Collapsed vertebra, not elsewhere classified, lumbar region, initial encounter for fracture: Secondary | ICD-10-CM | POA: Diagnosis not present

## 2018-12-12 DIAGNOSIS — I499 Cardiac arrhythmia, unspecified: Secondary | ICD-10-CM | POA: Diagnosis not present

## 2018-12-12 DIAGNOSIS — S299XXA Unspecified injury of thorax, initial encounter: Secondary | ICD-10-CM | POA: Diagnosis not present

## 2018-12-12 DIAGNOSIS — I714 Abdominal aortic aneurysm, without rupture: Secondary | ICD-10-CM | POA: Diagnosis not present

## 2018-12-12 DIAGNOSIS — S0211GA Other fracture of occiput, right side, initial encounter for closed fracture: Secondary | ICD-10-CM | POA: Diagnosis not present

## 2018-12-12 DIAGNOSIS — S199XXA Unspecified injury of neck, initial encounter: Secondary | ICD-10-CM | POA: Diagnosis not present

## 2018-12-12 DIAGNOSIS — I10 Essential (primary) hypertension: Secondary | ICD-10-CM | POA: Diagnosis not present

## 2018-12-12 DIAGNOSIS — G8911 Acute pain due to trauma: Secondary | ICD-10-CM | POA: Diagnosis not present

## 2018-12-13 DIAGNOSIS — J9383 Other pneumothorax: Secondary | ICD-10-CM | POA: Diagnosis not present

## 2018-12-13 DIAGNOSIS — S2242XA Multiple fractures of ribs, left side, initial encounter for closed fracture: Secondary | ICD-10-CM | POA: Diagnosis not present

## 2018-12-13 DIAGNOSIS — D649 Anemia, unspecified: Secondary | ICD-10-CM | POA: Diagnosis not present

## 2018-12-13 DIAGNOSIS — S065X0A Traumatic subdural hemorrhage without loss of consciousness, initial encounter: Secondary | ICD-10-CM | POA: Diagnosis not present

## 2018-12-13 DIAGNOSIS — S02113A Unspecified occipital condyle fracture, initial encounter for closed fracture: Secondary | ICD-10-CM | POA: Diagnosis not present

## 2018-12-17 DIAGNOSIS — S80812A Abrasion, left lower leg, initial encounter: Secondary | ICD-10-CM | POA: Diagnosis not present

## 2018-12-17 DIAGNOSIS — Z09 Encounter for follow-up examination after completed treatment for conditions other than malignant neoplasm: Secondary | ICD-10-CM | POA: Diagnosis not present

## 2018-12-17 DIAGNOSIS — S40212A Abrasion of left shoulder, initial encounter: Secondary | ICD-10-CM | POA: Diagnosis not present

## 2018-12-17 DIAGNOSIS — Z23 Encounter for immunization: Secondary | ICD-10-CM | POA: Diagnosis not present

## 2018-12-28 DIAGNOSIS — S62114D Nondisplaced fracture of triquetrum [cuneiform] bone, right wrist, subsequent encounter for fracture with routine healing: Secondary | ICD-10-CM | POA: Diagnosis not present

## 2018-12-29 DIAGNOSIS — S22000A Wedge compression fracture of unspecified thoracic vertebra, initial encounter for closed fracture: Secondary | ICD-10-CM | POA: Diagnosis not present

## 2018-12-29 DIAGNOSIS — S129XXA Fracture of neck, unspecified, initial encounter: Secondary | ICD-10-CM | POA: Diagnosis not present

## 2018-12-29 DIAGNOSIS — E785 Hyperlipidemia, unspecified: Secondary | ICD-10-CM | POA: Diagnosis not present

## 2018-12-29 DIAGNOSIS — Z0000 Encounter for general adult medical examination without abnormal findings: Secondary | ICD-10-CM | POA: Diagnosis not present

## 2018-12-29 DIAGNOSIS — I6200 Nontraumatic subdural hemorrhage, unspecified: Secondary | ICD-10-CM | POA: Diagnosis not present

## 2018-12-29 DIAGNOSIS — I723 Aneurysm of iliac artery: Secondary | ICD-10-CM | POA: Diagnosis not present

## 2018-12-29 DIAGNOSIS — Z125 Encounter for screening for malignant neoplasm of prostate: Secondary | ICD-10-CM | POA: Diagnosis not present

## 2018-12-29 DIAGNOSIS — S62116D Nondisplaced fracture of triquetrum [cuneiform] bone, unspecified wrist, subsequent encounter for fracture with routine healing: Secondary | ICD-10-CM | POA: Diagnosis not present

## 2018-12-29 DIAGNOSIS — I10 Essential (primary) hypertension: Secondary | ICD-10-CM | POA: Diagnosis not present

## 2018-12-29 DIAGNOSIS — R7303 Prediabetes: Secondary | ICD-10-CM | POA: Diagnosis not present

## 2018-12-29 DIAGNOSIS — S2249XD Multiple fractures of ribs, unspecified side, subsequent encounter for fracture with routine healing: Secondary | ICD-10-CM | POA: Diagnosis not present

## 2018-12-29 DIAGNOSIS — Z79899 Other long term (current) drug therapy: Secondary | ICD-10-CM | POA: Diagnosis not present

## 2019-01-05 DIAGNOSIS — S2242XA Multiple fractures of ribs, left side, initial encounter for closed fracture: Secondary | ICD-10-CM | POA: Diagnosis not present

## 2019-01-05 DIAGNOSIS — T1490XA Injury, unspecified, initial encounter: Secondary | ICD-10-CM | POA: Diagnosis not present

## 2019-01-05 DIAGNOSIS — S2242XD Multiple fractures of ribs, left side, subsequent encounter for fracture with routine healing: Secondary | ICD-10-CM | POA: Diagnosis not present

## 2019-01-13 DIAGNOSIS — M4856XD Collapsed vertebra, not elsewhere classified, lumbar region, subsequent encounter for fracture with routine healing: Secondary | ICD-10-CM | POA: Diagnosis not present

## 2019-01-13 DIAGNOSIS — S32028A Other fracture of second lumbar vertebra, initial encounter for closed fracture: Secondary | ICD-10-CM | POA: Diagnosis not present

## 2019-01-13 DIAGNOSIS — S0211AD Type I occipital condyle fracture, right side, subsequent encounter for fracture with routine healing: Secondary | ICD-10-CM | POA: Diagnosis not present

## 2019-01-13 DIAGNOSIS — S0211AA Type I occipital condyle fracture, right side, initial encounter for closed fracture: Secondary | ICD-10-CM | POA: Diagnosis not present

## 2019-01-13 DIAGNOSIS — S32018A Other fracture of first lumbar vertebra, initial encounter for closed fracture: Secondary | ICD-10-CM | POA: Diagnosis not present

## 2019-01-13 DIAGNOSIS — S02113D Unspecified occipital condyle fracture, subsequent encounter for fracture with routine healing: Secondary | ICD-10-CM | POA: Diagnosis not present

## 2019-02-10 DIAGNOSIS — R21 Rash and other nonspecific skin eruption: Secondary | ICD-10-CM | POA: Diagnosis not present

## 2019-02-16 DIAGNOSIS — M955 Acquired deformity of pelvis: Secondary | ICD-10-CM | POA: Diagnosis not present

## 2019-02-16 DIAGNOSIS — M9905 Segmental and somatic dysfunction of pelvic region: Secondary | ICD-10-CM | POA: Diagnosis not present

## 2019-02-16 DIAGNOSIS — M62830 Muscle spasm of back: Secondary | ICD-10-CM | POA: Diagnosis not present

## 2019-02-16 DIAGNOSIS — M9903 Segmental and somatic dysfunction of lumbar region: Secondary | ICD-10-CM | POA: Diagnosis not present

## 2019-03-02 DIAGNOSIS — M9903 Segmental and somatic dysfunction of lumbar region: Secondary | ICD-10-CM | POA: Diagnosis not present

## 2019-03-02 DIAGNOSIS — M955 Acquired deformity of pelvis: Secondary | ICD-10-CM | POA: Diagnosis not present

## 2019-03-02 DIAGNOSIS — M62830 Muscle spasm of back: Secondary | ICD-10-CM | POA: Diagnosis not present

## 2019-03-02 DIAGNOSIS — M9905 Segmental and somatic dysfunction of pelvic region: Secondary | ICD-10-CM | POA: Diagnosis not present

## 2019-03-23 DIAGNOSIS — M9905 Segmental and somatic dysfunction of pelvic region: Secondary | ICD-10-CM | POA: Diagnosis not present

## 2019-03-23 DIAGNOSIS — M9903 Segmental and somatic dysfunction of lumbar region: Secondary | ICD-10-CM | POA: Diagnosis not present

## 2019-03-23 DIAGNOSIS — M955 Acquired deformity of pelvis: Secondary | ICD-10-CM | POA: Diagnosis not present

## 2019-03-23 DIAGNOSIS — M62830 Muscle spasm of back: Secondary | ICD-10-CM | POA: Diagnosis not present

## 2019-04-20 DIAGNOSIS — M9903 Segmental and somatic dysfunction of lumbar region: Secondary | ICD-10-CM | POA: Diagnosis not present

## 2019-04-20 DIAGNOSIS — M62830 Muscle spasm of back: Secondary | ICD-10-CM | POA: Diagnosis not present

## 2019-04-20 DIAGNOSIS — M9905 Segmental and somatic dysfunction of pelvic region: Secondary | ICD-10-CM | POA: Diagnosis not present

## 2019-04-20 DIAGNOSIS — M955 Acquired deformity of pelvis: Secondary | ICD-10-CM | POA: Diagnosis not present

## 2019-05-15 ENCOUNTER — Ambulatory Visit: Payer: PPO

## 2019-05-20 ENCOUNTER — Ambulatory Visit: Payer: PPO

## 2019-05-22 ENCOUNTER — Ambulatory Visit: Payer: PPO

## 2019-05-25 DIAGNOSIS — M9905 Segmental and somatic dysfunction of pelvic region: Secondary | ICD-10-CM | POA: Diagnosis not present

## 2019-05-25 DIAGNOSIS — M955 Acquired deformity of pelvis: Secondary | ICD-10-CM | POA: Diagnosis not present

## 2019-05-25 DIAGNOSIS — M9903 Segmental and somatic dysfunction of lumbar region: Secondary | ICD-10-CM | POA: Diagnosis not present

## 2019-05-25 DIAGNOSIS — M62830 Muscle spasm of back: Secondary | ICD-10-CM | POA: Diagnosis not present

## 2019-06-22 DIAGNOSIS — M9903 Segmental and somatic dysfunction of lumbar region: Secondary | ICD-10-CM | POA: Diagnosis not present

## 2019-06-22 DIAGNOSIS — M62830 Muscle spasm of back: Secondary | ICD-10-CM | POA: Diagnosis not present

## 2019-06-22 DIAGNOSIS — M9905 Segmental and somatic dysfunction of pelvic region: Secondary | ICD-10-CM | POA: Diagnosis not present

## 2019-06-22 DIAGNOSIS — M955 Acquired deformity of pelvis: Secondary | ICD-10-CM | POA: Diagnosis not present

## 2019-08-03 DIAGNOSIS — M62830 Muscle spasm of back: Secondary | ICD-10-CM | POA: Diagnosis not present

## 2019-08-03 DIAGNOSIS — M9903 Segmental and somatic dysfunction of lumbar region: Secondary | ICD-10-CM | POA: Diagnosis not present

## 2019-08-03 DIAGNOSIS — M9905 Segmental and somatic dysfunction of pelvic region: Secondary | ICD-10-CM | POA: Diagnosis not present

## 2019-08-03 DIAGNOSIS — M955 Acquired deformity of pelvis: Secondary | ICD-10-CM | POA: Diagnosis not present

## 2019-08-31 DIAGNOSIS — M955 Acquired deformity of pelvis: Secondary | ICD-10-CM | POA: Diagnosis not present

## 2019-08-31 DIAGNOSIS — M9905 Segmental and somatic dysfunction of pelvic region: Secondary | ICD-10-CM | POA: Diagnosis not present

## 2019-08-31 DIAGNOSIS — M9903 Segmental and somatic dysfunction of lumbar region: Secondary | ICD-10-CM | POA: Diagnosis not present

## 2019-08-31 DIAGNOSIS — M62830 Muscle spasm of back: Secondary | ICD-10-CM | POA: Diagnosis not present

## 2019-09-28 DIAGNOSIS — M9905 Segmental and somatic dysfunction of pelvic region: Secondary | ICD-10-CM | POA: Diagnosis not present

## 2019-09-28 DIAGNOSIS — M9903 Segmental and somatic dysfunction of lumbar region: Secondary | ICD-10-CM | POA: Diagnosis not present

## 2019-09-28 DIAGNOSIS — M62830 Muscle spasm of back: Secondary | ICD-10-CM | POA: Diagnosis not present

## 2019-09-28 DIAGNOSIS — M955 Acquired deformity of pelvis: Secondary | ICD-10-CM | POA: Diagnosis not present

## 2019-10-26 DIAGNOSIS — M9905 Segmental and somatic dysfunction of pelvic region: Secondary | ICD-10-CM | POA: Diagnosis not present

## 2019-10-26 DIAGNOSIS — M955 Acquired deformity of pelvis: Secondary | ICD-10-CM | POA: Diagnosis not present

## 2019-10-26 DIAGNOSIS — M62830 Muscle spasm of back: Secondary | ICD-10-CM | POA: Diagnosis not present

## 2019-10-26 DIAGNOSIS — M9903 Segmental and somatic dysfunction of lumbar region: Secondary | ICD-10-CM | POA: Diagnosis not present

## 2019-12-07 DIAGNOSIS — M955 Acquired deformity of pelvis: Secondary | ICD-10-CM | POA: Diagnosis not present

## 2019-12-07 DIAGNOSIS — M9905 Segmental and somatic dysfunction of pelvic region: Secondary | ICD-10-CM | POA: Diagnosis not present

## 2019-12-07 DIAGNOSIS — M62830 Muscle spasm of back: Secondary | ICD-10-CM | POA: Diagnosis not present

## 2019-12-07 DIAGNOSIS — M9903 Segmental and somatic dysfunction of lumbar region: Secondary | ICD-10-CM | POA: Diagnosis not present

## 2020-01-04 DIAGNOSIS — M62830 Muscle spasm of back: Secondary | ICD-10-CM | POA: Diagnosis not present

## 2020-01-04 DIAGNOSIS — M9905 Segmental and somatic dysfunction of pelvic region: Secondary | ICD-10-CM | POA: Diagnosis not present

## 2020-01-04 DIAGNOSIS — M955 Acquired deformity of pelvis: Secondary | ICD-10-CM | POA: Diagnosis not present

## 2020-01-04 DIAGNOSIS — M9903 Segmental and somatic dysfunction of lumbar region: Secondary | ICD-10-CM | POA: Diagnosis not present

## 2020-01-20 DIAGNOSIS — Z125 Encounter for screening for malignant neoplasm of prostate: Secondary | ICD-10-CM | POA: Diagnosis not present

## 2020-01-20 DIAGNOSIS — Z79899 Other long term (current) drug therapy: Secondary | ICD-10-CM | POA: Diagnosis not present

## 2020-01-20 DIAGNOSIS — E785 Hyperlipidemia, unspecified: Secondary | ICD-10-CM | POA: Diagnosis not present

## 2020-01-20 DIAGNOSIS — I10 Essential (primary) hypertension: Secondary | ICD-10-CM | POA: Diagnosis not present

## 2020-01-20 DIAGNOSIS — I714 Abdominal aortic aneurysm, without rupture: Secondary | ICD-10-CM | POA: Diagnosis not present

## 2020-01-20 DIAGNOSIS — I723 Aneurysm of iliac artery: Secondary | ICD-10-CM | POA: Diagnosis not present

## 2020-01-20 DIAGNOSIS — R7303 Prediabetes: Secondary | ICD-10-CM | POA: Diagnosis not present

## 2020-01-20 DIAGNOSIS — R109 Unspecified abdominal pain: Secondary | ICD-10-CM | POA: Diagnosis not present

## 2020-01-20 DIAGNOSIS — Z0000 Encounter for general adult medical examination without abnormal findings: Secondary | ICD-10-CM | POA: Diagnosis not present

## 2020-02-01 DIAGNOSIS — M62830 Muscle spasm of back: Secondary | ICD-10-CM | POA: Diagnosis not present

## 2020-02-01 DIAGNOSIS — M9905 Segmental and somatic dysfunction of pelvic region: Secondary | ICD-10-CM | POA: Diagnosis not present

## 2020-02-01 DIAGNOSIS — M955 Acquired deformity of pelvis: Secondary | ICD-10-CM | POA: Diagnosis not present

## 2020-02-01 DIAGNOSIS — M9903 Segmental and somatic dysfunction of lumbar region: Secondary | ICD-10-CM | POA: Diagnosis not present

## 2020-02-29 DIAGNOSIS — M62830 Muscle spasm of back: Secondary | ICD-10-CM | POA: Diagnosis not present

## 2020-02-29 DIAGNOSIS — M9903 Segmental and somatic dysfunction of lumbar region: Secondary | ICD-10-CM | POA: Diagnosis not present

## 2020-02-29 DIAGNOSIS — M9905 Segmental and somatic dysfunction of pelvic region: Secondary | ICD-10-CM | POA: Diagnosis not present

## 2020-02-29 DIAGNOSIS — M955 Acquired deformity of pelvis: Secondary | ICD-10-CM | POA: Diagnosis not present

## 2020-04-04 DIAGNOSIS — M9905 Segmental and somatic dysfunction of pelvic region: Secondary | ICD-10-CM | POA: Diagnosis not present

## 2020-04-04 DIAGNOSIS — M62830 Muscle spasm of back: Secondary | ICD-10-CM | POA: Diagnosis not present

## 2020-04-04 DIAGNOSIS — M9903 Segmental and somatic dysfunction of lumbar region: Secondary | ICD-10-CM | POA: Diagnosis not present

## 2020-04-04 DIAGNOSIS — M955 Acquired deformity of pelvis: Secondary | ICD-10-CM | POA: Diagnosis not present

## 2020-05-02 DIAGNOSIS — M62830 Muscle spasm of back: Secondary | ICD-10-CM | POA: Diagnosis not present

## 2020-05-02 DIAGNOSIS — M9903 Segmental and somatic dysfunction of lumbar region: Secondary | ICD-10-CM | POA: Diagnosis not present

## 2020-05-02 DIAGNOSIS — M955 Acquired deformity of pelvis: Secondary | ICD-10-CM | POA: Diagnosis not present

## 2020-05-02 DIAGNOSIS — M9905 Segmental and somatic dysfunction of pelvic region: Secondary | ICD-10-CM | POA: Diagnosis not present

## 2020-05-30 DIAGNOSIS — M9903 Segmental and somatic dysfunction of lumbar region: Secondary | ICD-10-CM | POA: Diagnosis not present

## 2020-05-30 DIAGNOSIS — M955 Acquired deformity of pelvis: Secondary | ICD-10-CM | POA: Diagnosis not present

## 2020-05-30 DIAGNOSIS — M62830 Muscle spasm of back: Secondary | ICD-10-CM | POA: Diagnosis not present

## 2020-05-30 DIAGNOSIS — M9905 Segmental and somatic dysfunction of pelvic region: Secondary | ICD-10-CM | POA: Diagnosis not present

## 2020-07-04 DIAGNOSIS — M955 Acquired deformity of pelvis: Secondary | ICD-10-CM | POA: Diagnosis not present

## 2020-07-04 DIAGNOSIS — M9903 Segmental and somatic dysfunction of lumbar region: Secondary | ICD-10-CM | POA: Diagnosis not present

## 2020-07-04 DIAGNOSIS — M9905 Segmental and somatic dysfunction of pelvic region: Secondary | ICD-10-CM | POA: Diagnosis not present

## 2020-07-04 DIAGNOSIS — M62830 Muscle spasm of back: Secondary | ICD-10-CM | POA: Diagnosis not present

## 2020-08-15 DIAGNOSIS — M9905 Segmental and somatic dysfunction of pelvic region: Secondary | ICD-10-CM | POA: Diagnosis not present

## 2020-08-15 DIAGNOSIS — M62830 Muscle spasm of back: Secondary | ICD-10-CM | POA: Diagnosis not present

## 2020-08-15 DIAGNOSIS — M955 Acquired deformity of pelvis: Secondary | ICD-10-CM | POA: Diagnosis not present

## 2020-08-15 DIAGNOSIS — M9903 Segmental and somatic dysfunction of lumbar region: Secondary | ICD-10-CM | POA: Diagnosis not present

## 2020-09-12 DIAGNOSIS — M9905 Segmental and somatic dysfunction of pelvic region: Secondary | ICD-10-CM | POA: Diagnosis not present

## 2020-09-12 DIAGNOSIS — M62830 Muscle spasm of back: Secondary | ICD-10-CM | POA: Diagnosis not present

## 2020-09-12 DIAGNOSIS — M955 Acquired deformity of pelvis: Secondary | ICD-10-CM | POA: Diagnosis not present

## 2020-09-12 DIAGNOSIS — M9903 Segmental and somatic dysfunction of lumbar region: Secondary | ICD-10-CM | POA: Diagnosis not present

## 2021-01-31 DIAGNOSIS — K760 Fatty (change of) liver, not elsewhere classified: Secondary | ICD-10-CM | POA: Diagnosis not present

## 2021-01-31 DIAGNOSIS — I723 Aneurysm of iliac artery: Secondary | ICD-10-CM | POA: Diagnosis not present

## 2021-01-31 DIAGNOSIS — Z79899 Other long term (current) drug therapy: Secondary | ICD-10-CM | POA: Diagnosis not present

## 2021-01-31 DIAGNOSIS — Z0000 Encounter for general adult medical examination without abnormal findings: Secondary | ICD-10-CM | POA: Diagnosis not present

## 2021-01-31 DIAGNOSIS — R7303 Prediabetes: Secondary | ICD-10-CM | POA: Diagnosis not present

## 2021-01-31 DIAGNOSIS — E785 Hyperlipidemia, unspecified: Secondary | ICD-10-CM | POA: Diagnosis not present

## 2021-01-31 DIAGNOSIS — M5134 Other intervertebral disc degeneration, thoracic region: Secondary | ICD-10-CM | POA: Diagnosis not present

## 2021-01-31 DIAGNOSIS — Z125 Encounter for screening for malignant neoplasm of prostate: Secondary | ICD-10-CM | POA: Diagnosis not present

## 2021-01-31 DIAGNOSIS — I714 Abdominal aortic aneurysm, without rupture, unspecified: Secondary | ICD-10-CM | POA: Diagnosis not present

## 2021-01-31 DIAGNOSIS — I10 Essential (primary) hypertension: Secondary | ICD-10-CM | POA: Diagnosis not present

## 2021-02-12 ENCOUNTER — Ambulatory Visit: Payer: PPO

## 2021-02-14 ENCOUNTER — Other Ambulatory Visit: Payer: Self-pay | Admitting: Family Medicine

## 2021-02-14 DIAGNOSIS — I714 Abdominal aortic aneurysm, without rupture, unspecified: Secondary | ICD-10-CM

## 2021-02-25 ENCOUNTER — Other Ambulatory Visit (HOSPITAL_BASED_OUTPATIENT_CLINIC_OR_DEPARTMENT_OTHER): Payer: Self-pay

## 2021-02-25 ENCOUNTER — Ambulatory Visit: Payer: PPO | Attending: Internal Medicine

## 2021-02-25 DIAGNOSIS — Z23 Encounter for immunization: Secondary | ICD-10-CM

## 2021-02-25 MED ORDER — MODERNA COVID-19 BIVAL BOOSTER 50 MCG/0.5ML IM SUSP
INTRAMUSCULAR | 0 refills | Status: DC
  Filled 2021-02-25: qty 0.5, 1d supply, fill #0

## 2021-02-25 NOTE — Progress Notes (Signed)
   Covid-19 Vaccination Clinic  Name:  Adam Shepherd    MRN: [id number] DOB: [birth year]  02/25/2021  [name] was observed post Covid-19 immunization for 15 minutes without incident. He was provided with Vaccine Information Sheet and instruction to access the V-Safe system.   [name] was instructed to call 911 with any severe reactions post vaccine: Difficulty breathing  Swelling of face and throat  A fast heartbeat  A bad rash all over body  Dizziness and weakness   Immunizations Administered     Name Date Dose VIS Date Route   Moderna Covid-19 vaccine Bivalent Booster 02/25/2021 11:35 AM 0.5 mL 11/24/2020 Intramuscular   Manufacturer: Moderna   Lot: [id number]   [place]: [id number]

## 2021-03-29 ENCOUNTER — Other Ambulatory Visit (HOSPITAL_BASED_OUTPATIENT_CLINIC_OR_DEPARTMENT_OTHER): Payer: Self-pay

## 2021-03-29 MED ORDER — FLUAD QUADRIVALENT 0.5 ML IM PRSY
PREFILLED_SYRINGE | INTRAMUSCULAR | 0 refills | Status: DC
Start: 2021-03-29 — End: 2023-04-22
  Filled 2021-03-29: qty 0.5, 1d supply, fill #0

## 2021-06-05 ENCOUNTER — Emergency Department (HOSPITAL_BASED_OUTPATIENT_CLINIC_OR_DEPARTMENT_OTHER)
Admission: EM | Admit: 2021-06-05 | Discharge: 2021-06-05 | Disposition: A | Discharge status: Home / Self Care | Payer: PPO | Attending: Emergency Medicine | Admitting: Emergency Medicine

## 2021-06-05 ENCOUNTER — Encounter (HOSPITAL_BASED_OUTPATIENT_CLINIC_OR_DEPARTMENT_OTHER): Payer: Self-pay

## 2021-06-05 ENCOUNTER — Other Ambulatory Visit: Payer: Self-pay

## 2021-06-05 DIAGNOSIS — Z5321 Procedure and treatment not carried out due to patient leaving prior to being seen by health care provider: Secondary | ICD-10-CM | POA: Insufficient documentation

## 2021-06-05 DIAGNOSIS — R059 Cough, unspecified: Secondary | ICD-10-CM | POA: Insufficient documentation

## 2021-06-05 DIAGNOSIS — R0981 Nasal congestion: Secondary | ICD-10-CM | POA: Diagnosis not present

## 2021-06-05 DIAGNOSIS — R519 Headache, unspecified: Secondary | ICD-10-CM | POA: Insufficient documentation

## 2021-06-05 DIAGNOSIS — R070 Pain in throat: Secondary | ICD-10-CM | POA: Diagnosis not present

## 2021-06-05 DIAGNOSIS — Z20822 Contact with and (suspected) exposure to covid-19: Secondary | ICD-10-CM | POA: Insufficient documentation

## 2021-06-05 LAB — RESP PANEL BY RT-PCR (FLU A&B, COVID) ARPGX2
Influenza A by PCR: NEGATIVE
Influenza B by PCR: NEGATIVE
SARS Coronavirus 2 by RT PCR: NEGATIVE

## 2021-06-05 LAB — GROUP A STREP BY PCR: Group A Strep by PCR: NOT DETECTED

## 2021-06-05 NOTE — ED Notes (Signed)
Patient left...advised he is getting worse waiting and his medicine is home.  Wanted results from test--I advised he checks my chart in the am

## 2021-06-05 NOTE — ED Triage Notes (Signed)
Patient here POV from Home with URI Symptoms.  Include Sore Throat, Headache, Productive Cough and Congestion. Symptoms present since yesterday and have continued.   No Known Fevers. No N/V/D.   NAD Noted during Triage. A&Ox4. GCS 15. Ambulatory.

## 2021-06-10 DIAGNOSIS — B349 Viral infection, unspecified: Secondary | ICD-10-CM | POA: Diagnosis not present

## 2021-06-10 DIAGNOSIS — H1033 Unspecified acute conjunctivitis, bilateral: Secondary | ICD-10-CM | POA: Diagnosis not present

## 2022-02-14 DIAGNOSIS — K760 Fatty (change of) liver, not elsewhere classified: Secondary | ICD-10-CM | POA: Diagnosis not present

## 2022-02-14 DIAGNOSIS — I10 Essential (primary) hypertension: Secondary | ICD-10-CM | POA: Diagnosis not present

## 2022-02-14 DIAGNOSIS — I723 Aneurysm of iliac artery: Secondary | ICD-10-CM | POA: Diagnosis not present

## 2022-02-14 DIAGNOSIS — Z0000 Encounter for general adult medical examination without abnormal findings: Secondary | ICD-10-CM | POA: Diagnosis not present

## 2022-02-14 DIAGNOSIS — Z79899 Other long term (current) drug therapy: Secondary | ICD-10-CM | POA: Diagnosis not present

## 2022-02-14 DIAGNOSIS — M5134 Other intervertebral disc degeneration, thoracic region: Secondary | ICD-10-CM | POA: Diagnosis not present

## 2022-02-14 DIAGNOSIS — R7303 Prediabetes: Secondary | ICD-10-CM | POA: Diagnosis not present

## 2022-02-14 DIAGNOSIS — I714 Abdominal aortic aneurysm, without rupture, unspecified: Secondary | ICD-10-CM | POA: Diagnosis not present

## 2022-02-14 DIAGNOSIS — E785 Hyperlipidemia, unspecified: Secondary | ICD-10-CM | POA: Diagnosis not present

## 2022-02-14 DIAGNOSIS — Z125 Encounter for screening for malignant neoplasm of prostate: Secondary | ICD-10-CM | POA: Diagnosis not present

## 2022-03-03 ENCOUNTER — Other Ambulatory Visit: Payer: Self-pay

## 2022-03-03 DIAGNOSIS — I714 Abdominal aortic aneurysm, without rupture, unspecified: Secondary | ICD-10-CM

## 2022-03-04 ENCOUNTER — Encounter: Payer: Self-pay | Admitting: Vascular Surgery

## 2022-03-04 ENCOUNTER — Ambulatory Visit (HOSPITAL_COMMUNITY)
Admission: RE | Admit: 2022-03-04 | Discharge: 2022-03-04 | Disposition: A | Discharge status: Home / Self Care | Payer: PPO | Source: Ambulatory Visit | Attending: Vascular Surgery | Admitting: Vascular Surgery

## 2022-03-04 ENCOUNTER — Ambulatory Visit: Payer: PPO | Admitting: Vascular Surgery

## 2022-03-04 VITALS — BP 136/81 | HR 67 | Temp 97.9°F | Resp 16 | Ht 67.0 in | Wt 196.0 lb

## 2022-03-04 DIAGNOSIS — I714 Abdominal aortic aneurysm, without rupture, unspecified: Secondary | ICD-10-CM | POA: Insufficient documentation

## 2022-03-04 DIAGNOSIS — I7143 Infrarenal abdominal aortic aneurysm, without rupture: Secondary | ICD-10-CM | POA: Diagnosis not present

## 2022-03-04 NOTE — Progress Notes (Signed)
Patient name: Adam Shepherd MRN: [id number] DOB: 1945/05/27 Sex: male  REASON FOR CONSULT: AAA  HPI: Adam Shepherd is a 76 y.o. male, with history of hypertension and hyperlipidemia who presents for evaluation of known abdominal aortic aneurysm.  He was last seen in our practice in 2018 when his aneurysm measured 3.8 cm.  He was lost to surveillance during [place].  He denies any abdominal pain today.  He has no known family history as he grew up in an orphanage.  No previous abdominal surgery.  Past Medical History:  Diagnosis Date   AAA (abdominal aortic aneurysm) (HCC)    Allergy    Chronic kidney disease    Degenerative joint disease    Diverticulosis    Hx of adenomatous colonic polyps 10/29/2015   Hyperlipidemia    Hypertension     Past Surgical History:  Procedure Laterality Date   CATARACT EXTRACTION Bilateral 2001   FINGER SURGERY Bilateral 1965   after traumatic injury   TONSILLECTOMY  21    Family History  Family history unknown: Yes    SOCIAL HISTORY: Social History   Socioeconomic History   Marital status: Married    Spouse name: Not on file   Number of children: Not on file   Years of education: Not on file   Highest education level: Not on file  Occupational History   Not on file  Tobacco Use   Smoking status: Former    Types: Cigarettes    Quit date: 11/07/1966    Years since quitting: 55.3   Smokeless tobacco: Never  Substance and Sexual Activity   Alcohol use: No    Alcohol/week: 0.0 standard drinks of alcohol   Drug use: No   Sexual activity: Not on file  Other Topics Concern   Not on file  Social History Narrative   Not on file   Social Determinants of Health   Financial Resource Strain: Not on file  Food Insecurity: Not on file  Transportation Needs: Not on file  Physical Activity: Not on file  Stress: Not on file  Social Connections: Not on file  Intimate Partner Violence: Not on file    No Known Allergies  Current  Outpatient Medications  Medication Sig Dispense Refill   aspirin EC 81 MG tablet Take 81 mg by mouth daily.     atenolol (TENORMIN) 25 MG tablet Take 25 mg by mouth daily.     bisoprolol (ZEBETA) 10 MG tablet      clotrimazole-betamethasone (LOTRISONE) cream Apply 1 application topically 2 (two) times daily. Reported on 10/05/2015     COVID-19 mRNA bivalent vaccine, Moderna, (MODERNA COVID-19 BIVAL BOOSTER) 50 MCG/0.5ML injection Inject into the muscle. 0.5 mL 0   fluticasone (FLONASE) 50 MCG/ACT nasal spray Place 2 sprays into both nostrils daily as needed for allergies or rhinitis. Reported on 10/05/2015     influenza vaccine adjuvanted (FLUAD QUADRIVALENT) 0.5 ML injection Inject into the muscle. 0.5 mL 0   No current facility-administered medications for this visit.    REVIEW OF SYSTEMS:  '[X]'$  denotes positive finding, '[ ]'$  denotes negative finding Cardiac  Comments:  Chest pain or chest pressure:    Shortness of breath upon exertion:    Short of breath when lying flat:    Irregular heart rhythm:        Vascular    Pain in calf, thigh, or hip brought on by ambulation:    Pain in feet at night that wakes you up  from your sleep:     Blood clot in your veins:    Leg swelling:         Pulmonary    Oxygen at home:    Productive cough:     Wheezing:         Neurologic    Sudden weakness in arms or legs:     Sudden numbness in arms or legs:     Sudden onset of difficulty speaking or slurred speech:    Temporary loss of vision in one eye:     Problems with dizziness:         Gastrointestinal    Blood in stool:     Vomited blood:         Genitourinary    Burning when urinating:     Blood in urine:        Psychiatric    Major depression:         Hematologic    Bleeding problems:    Problems with blood clotting too easily:        Skin    Rashes or ulcers:        Constitutional    Fever or chills:      PHYSICAL EXAM: Vitals:   03/04/22 0828  Resp: 16  SpO2: 96%   Weight: 196 lb (88.9 kg)  Height: '5\' 7"'$  (1.702 m)    GENERAL: The patient is a well-nourished male, in no acute distress. The vital signs are documented above. CARDIAC: There is a regular rate and rhythm.  VASCULAR:  Palpable femoral pulses bilaterally Palpable DP pulses bilaterally PULMONARY: No respiratory distress. ABDOMEN: Soft and non-tender.  No pain with palpation of aneurysm. MUSCULOSKELETAL: There are no major deformities or cyanosis. NEUROLOGIC: No focal weakness or paresthesias are detected. SKIN: There are no ulcers or rashes noted. PSYCHIATRIC: The patient has a normal affect.  DATA:   AAA duplex today shows abdominal aortic aneurysm measuring 5.1 cm  Assessment/Plan:  76 year old male presents for evaluation of known abdominal aortic aneurysm.  He was last seen in our practice in 2018 when it measured 3.8 cm.  Discussed over the last 5 years his aneurysm now measures 5.1 cm by duplex today.  He has no pain with palpation of his aneurysm.  Discussed that in men we repair these at greater than 5.5 cm unless it is felt to be symptomatic or there is rapid growth.  Discussed I will plan to see him again in 6 months with AAA duplex here in the office.  I discussed the basic steps of CT scan to evaluate the anatomy and for stent graft repair although he does not need surgery at this time.  Discussed he call with questions or concerns.   Marty Heck, MD [facility] Office: [phone]

## 2022-03-12 ENCOUNTER — Other Ambulatory Visit: Payer: Self-pay

## 2022-03-12 DIAGNOSIS — I714 Abdominal aortic aneurysm, without rupture, unspecified: Secondary | ICD-10-CM

## 2022-03-18 DIAGNOSIS — I10 Essential (primary) hypertension: Secondary | ICD-10-CM | POA: Diagnosis not present

## 2022-03-18 DIAGNOSIS — E1169 Type 2 diabetes mellitus with other specified complication: Secondary | ICD-10-CM | POA: Diagnosis not present

## 2022-03-18 DIAGNOSIS — E785 Hyperlipidemia, unspecified: Secondary | ICD-10-CM | POA: Diagnosis not present

## 2022-04-24 DIAGNOSIS — E1169 Type 2 diabetes mellitus with other specified complication: Secondary | ICD-10-CM | POA: Diagnosis not present

## 2022-04-24 DIAGNOSIS — E785 Hyperlipidemia, unspecified: Secondary | ICD-10-CM | POA: Diagnosis not present

## 2022-04-24 DIAGNOSIS — Z6831 Body mass index (BMI) 31.0-31.9, adult: Secondary | ICD-10-CM | POA: Diagnosis not present

## 2022-04-24 DIAGNOSIS — I10 Essential (primary) hypertension: Secondary | ICD-10-CM | POA: Diagnosis not present

## 2022-06-20 DIAGNOSIS — I10 Essential (primary) hypertension: Secondary | ICD-10-CM | POA: Diagnosis not present

## 2022-06-20 DIAGNOSIS — E785 Hyperlipidemia, unspecified: Secondary | ICD-10-CM | POA: Diagnosis not present

## 2022-06-20 DIAGNOSIS — E1169 Type 2 diabetes mellitus with other specified complication: Secondary | ICD-10-CM | POA: Diagnosis not present

## 2022-06-20 DIAGNOSIS — E119 Type 2 diabetes mellitus without complications: Secondary | ICD-10-CM | POA: Diagnosis not present

## 2022-06-20 DIAGNOSIS — I714 Abdominal aortic aneurysm, without rupture, unspecified: Secondary | ICD-10-CM | POA: Diagnosis not present

## 2022-08-03 DIAGNOSIS — S50861A Insect bite (nonvenomous) of right forearm, initial encounter: Secondary | ICD-10-CM | POA: Diagnosis not present

## 2022-09-16 ENCOUNTER — Ambulatory Visit (INDEPENDENT_AMBULATORY_CARE_PROVIDER_SITE_OTHER): Payer: PPO | Admitting: Vascular Surgery

## 2022-09-16 ENCOUNTER — Encounter: Payer: Self-pay | Admitting: Vascular Surgery

## 2022-09-16 ENCOUNTER — Ambulatory Visit (HOSPITAL_COMMUNITY)
Admission: RE | Admit: 2022-09-16 | Discharge: 2022-09-16 | Disposition: A | Discharge status: Home / Self Care | Payer: PPO | Source: Ambulatory Visit | Attending: Vascular Surgery | Admitting: Vascular Surgery

## 2022-09-16 VITALS — BP 144/79 | HR 63 | Temp 98.0°F | Resp 16 | Ht 67.0 in | Wt 190.0 lb

## 2022-09-16 DIAGNOSIS — I714 Abdominal aortic aneurysm, without rupture, unspecified: Secondary | ICD-10-CM | POA: Diagnosis not present

## 2022-09-16 DIAGNOSIS — I7143 Infrarenal abdominal aortic aneurysm, without rupture: Secondary | ICD-10-CM | POA: Diagnosis not present

## 2022-09-16 NOTE — Progress Notes (Signed)
Patient name: Adam Shepherd MRN: [id number] DOB: Nov 09, 1945 Sex: male  REASON FOR CONSULT: 6 month f/u for 5.1 cm AAA  HPI: Adam Shepherd is a 77 y.o. male, with history of hypertension and hyperlipidemia who presents for 6 month follow-up for known abdominal aortic aneurysm.  He was last seen in our practice on 03/04/2022 with a 5.1 cm abdominal aortic aneurysm.  He denies any abdominal pain today.  No changes to his health since last follow-up.  He has no known family history as he grew up in an orphanage.  No previous abdominal surgery.  Past Medical History:  Diagnosis Date   AAA (abdominal aortic aneurysm) (HCC)    Allergy    Chronic kidney disease    Degenerative joint disease    Diverticulosis    Hx of adenomatous colonic polyps 10/29/2015   Hyperlipidemia    Hypertension     Past Surgical History:  Procedure Laterality Date   CATARACT EXTRACTION Bilateral 2001   FINGER SURGERY Bilateral 1965   after traumatic injury   TONSILLECTOMY  75    Family History  Family history unknown: Yes    SOCIAL HISTORY: Social History   Socioeconomic History   Marital status: Married    Spouse name: Not on file   Number of children: Not on file   Years of education: Not on file   Highest education level: Not on file  Occupational History   Not on file  Tobacco Use   Smoking status: Former    Types: Cigarettes    Quit date: 11/07/1966    Years since quitting: 55.8   Smokeless tobacco: Never  Substance and Sexual Activity   Alcohol use: No    Alcohol/week: 0.0 standard drinks of alcohol   Drug use: No   Sexual activity: Not on file  Other Topics Concern   Not on file  Social History Narrative   Not on file   Social Determinants of Health   Financial Resource Strain: Not on file  Food Insecurity: Not on file  Transportation Needs: Not on file  Physical Activity: Not on file  Stress: Not on file  Social Connections: Not on file  Intimate Partner Violence: Not  on file    No Known Allergies  Current Outpatient Medications  Medication Sig Dispense Refill   bisoprolol (ZEBETA) 10 MG tablet      fluticasone (FLONASE) 50 MCG/ACT nasal spray Place 2 sprays into both nostrils daily as needed for allergies or rhinitis. Reported on 10/05/2015     aspirin EC 81 MG tablet Take 81 mg by mouth daily.     atenolol (TENORMIN) 25 MG tablet Take 25 mg by mouth daily. (Patient not taking: Reported on 03/04/2022)     clotrimazole-betamethasone (LOTRISONE) cream Apply 1 application topically 2 (two) times daily. Reported on 10/05/2015     COVID-19 mRNA bivalent vaccine, Moderna, (MODERNA COVID-19 BIVAL BOOSTER) 50 MCG/0.5ML injection Inject into the muscle. (Patient not taking: Reported on 09/16/2022) 0.5 mL 0   influenza vaccine adjuvanted (FLUAD QUADRIVALENT) 0.5 ML injection Inject into the muscle. (Patient not taking: Reported on 03/04/2022) 0.5 mL 0   No current facility-administered medications for this visit.    REVIEW OF SYSTEMS:  [X]  denotes positive finding, [ ]  denotes negative finding Cardiac  Comments:  Chest pain or chest pressure:    Shortness of breath upon exertion:    Short of breath when lying flat:    Irregular heart rhythm:  Vascular    Pain in calf, thigh, or hip brought on by ambulation:    Pain in feet at night that wakes you up from your sleep:     Blood clot in your veins:    Leg swelling:         Pulmonary    Oxygen at home:    Productive cough:     Wheezing:         Neurologic    Sudden weakness in arms or legs:     Sudden numbness in arms or legs:     Sudden onset of difficulty speaking or slurred speech:    Temporary loss of vision in one eye:     Problems with dizziness:         Gastrointestinal    Blood in stool:     Vomited blood:         Genitourinary    Burning when urinating:     Blood in urine:        Psychiatric    Major depression:         Hematologic    Bleeding problems:    Problems with blood  clotting too easily:        Skin    Rashes or ulcers:        Constitutional    Fever or chills:      PHYSICAL EXAM: Vitals:   09/16/22 0933  BP: (!) 144/79  Pulse: 63  Resp: 16  Temp: 98 F (36.7 C)  TempSrc: Temporal  SpO2: 93%  Weight: 190 lb (86.2 kg)  Height: 5\' 7"  (1.702 m)    GENERAL: The patient is a well-nourished male, in no acute distress. The vital signs are documented above. CARDIAC: There is a regular rate and rhythm.  VASCULAR:  Palpable femoral pulses bilaterally Palpable DP pulses bilaterally PULMONARY: No respiratory distress. ABDOMEN: Soft and non-tender.  No pain with palpation of aneurysm. MUSCULOSKELETAL: There are no major deformities or cyanosis. NEUROLOGIC: No focal weakness or paresthesias are detected. SKIN: There are no ulcers or rashes noted. PSYCHIATRIC: The patient has a normal affect.  DATA:   AAA duplex today shows 5.1 cm --> 5.05 cm AAA with no significant interval change  Assessment/Plan:  77 year old male presents for 6 month follow-up of known 5.1 cm abdominal aortic aneurysm.  His aneurysm appears stable over the last 6 months and measures 5.05 cm on duplex today.  I discussed no interval change over the last 6 months.  No indication for surgical intervention.  Again discussed indication for surgery are greater than 5.5 cm in men unless there is rapid growth or he is felt to develop symptoms from his aneurysm.  Will see him in 6 months with AAA duplex here in the office.  Discussed he call with questions or concerns.   Cephus Shelling, MD [facility] [place] Office: [phone]

## 2022-09-18 ENCOUNTER — Other Ambulatory Visit: Payer: Self-pay

## 2022-09-18 DIAGNOSIS — I7143 Infrarenal abdominal aortic aneurysm, without rupture: Secondary | ICD-10-CM

## 2023-02-24 DIAGNOSIS — I10 Essential (primary) hypertension: Secondary | ICD-10-CM | POA: Diagnosis not present

## 2023-02-24 DIAGNOSIS — E1169 Type 2 diabetes mellitus with other specified complication: Secondary | ICD-10-CM | POA: Diagnosis not present

## 2023-02-24 DIAGNOSIS — Z0000 Encounter for general adult medical examination without abnormal findings: Secondary | ICD-10-CM | POA: Diagnosis not present

## 2023-02-24 DIAGNOSIS — G3184 Mild cognitive impairment, so stated: Secondary | ICD-10-CM | POA: Diagnosis not present

## 2023-02-24 DIAGNOSIS — Z125 Encounter for screening for malignant neoplasm of prostate: Secondary | ICD-10-CM | POA: Diagnosis not present

## 2023-02-24 DIAGNOSIS — E785 Hyperlipidemia, unspecified: Secondary | ICD-10-CM | POA: Diagnosis not present

## 2023-02-24 DIAGNOSIS — Z79899 Other long term (current) drug therapy: Secondary | ICD-10-CM | POA: Diagnosis not present

## 2023-03-09 DIAGNOSIS — D72829 Elevated white blood cell count, unspecified: Secondary | ICD-10-CM | POA: Diagnosis not present

## 2023-03-09 DIAGNOSIS — R29706 NIHSS score 6: Secondary | ICD-10-CM | POA: Diagnosis not present

## 2023-03-09 DIAGNOSIS — R2990 Unspecified symptoms and signs involving the nervous system: Secondary | ICD-10-CM | POA: Diagnosis not present

## 2023-03-09 DIAGNOSIS — R972 Elevated prostate specific antigen [PSA]: Secondary | ICD-10-CM | POA: Diagnosis not present

## 2023-03-09 DIAGNOSIS — R29701 NIHSS score 1: Secondary | ICD-10-CM | POA: Diagnosis not present

## 2023-03-09 DIAGNOSIS — R29702 NIHSS score 2: Secondary | ICD-10-CM | POA: Diagnosis not present

## 2023-03-09 DIAGNOSIS — Z87891 Personal history of nicotine dependence: Secondary | ICD-10-CM | POA: Diagnosis not present

## 2023-03-09 DIAGNOSIS — Z743 Need for continuous supervision: Secondary | ICD-10-CM | POA: Diagnosis not present

## 2023-03-09 DIAGNOSIS — I63412 Cerebral infarction due to embolism of left middle cerebral artery: Secondary | ICD-10-CM | POA: Diagnosis not present

## 2023-03-09 DIAGNOSIS — I714 Abdominal aortic aneurysm, without rupture, unspecified: Secondary | ICD-10-CM | POA: Diagnosis not present

## 2023-03-09 DIAGNOSIS — R4182 Altered mental status, unspecified: Secondary | ICD-10-CM | POA: Diagnosis not present

## 2023-03-09 DIAGNOSIS — I6602 Occlusion and stenosis of left middle cerebral artery: Secondary | ICD-10-CM | POA: Diagnosis not present

## 2023-03-09 DIAGNOSIS — I639 Cerebral infarction, unspecified: Secondary | ICD-10-CM | POA: Diagnosis not present

## 2023-03-09 DIAGNOSIS — I6389 Other cerebral infarction: Secondary | ICD-10-CM | POA: Diagnosis not present

## 2023-03-09 DIAGNOSIS — I6522 Occlusion and stenosis of left carotid artery: Secondary | ICD-10-CM | POA: Diagnosis not present

## 2023-03-09 DIAGNOSIS — R7303 Prediabetes: Secondary | ICD-10-CM | POA: Diagnosis not present

## 2023-03-09 DIAGNOSIS — I10 Essential (primary) hypertension: Secondary | ICD-10-CM | POA: Diagnosis not present

## 2023-03-09 DIAGNOSIS — E785 Hyperlipidemia, unspecified: Secondary | ICD-10-CM | POA: Diagnosis not present

## 2023-03-09 DIAGNOSIS — R29703 NIHSS score 3: Secondary | ICD-10-CM | POA: Diagnosis not present

## 2023-03-09 DIAGNOSIS — R4701 Aphasia: Secondary | ICD-10-CM | POA: Diagnosis not present

## 2023-03-09 DIAGNOSIS — G459 Transient cerebral ischemic attack, unspecified: Secondary | ICD-10-CM | POA: Diagnosis not present

## 2023-03-09 DIAGNOSIS — I6523 Occlusion and stenosis of bilateral carotid arteries: Secondary | ICD-10-CM | POA: Diagnosis not present

## 2023-03-09 HISTORY — DX: Cerebral infarction, unspecified: I63.9

## 2023-03-13 HISTORY — PX: OTHER SURGICAL HISTORY: SHX169

## 2023-03-18 DIAGNOSIS — M109 Gout, unspecified: Secondary | ICD-10-CM | POA: Diagnosis not present

## 2023-03-18 DIAGNOSIS — Z79899 Other long term (current) drug therapy: Secondary | ICD-10-CM | POA: Diagnosis not present

## 2023-03-18 DIAGNOSIS — I69320 Aphasia following cerebral infarction: Secondary | ICD-10-CM | POA: Diagnosis not present

## 2023-03-18 DIAGNOSIS — R972 Elevated prostate specific antigen [PSA]: Secondary | ICD-10-CM | POA: Diagnosis not present

## 2023-03-18 DIAGNOSIS — I10 Essential (primary) hypertension: Secondary | ICD-10-CM | POA: Diagnosis not present

## 2023-03-19 ENCOUNTER — Encounter: Payer: Self-pay | Admitting: Neurology

## 2023-03-19 DIAGNOSIS — I10 Essential (primary) hypertension: Secondary | ICD-10-CM | POA: Diagnosis not present

## 2023-03-19 DIAGNOSIS — M5134 Other intervertebral disc degeneration, thoracic region: Secondary | ICD-10-CM | POA: Diagnosis not present

## 2023-03-19 DIAGNOSIS — K760 Fatty (change of) liver, not elsewhere classified: Secondary | ICD-10-CM | POA: Diagnosis not present

## 2023-03-19 DIAGNOSIS — Z87891 Personal history of nicotine dependence: Secondary | ICD-10-CM | POA: Diagnosis not present

## 2023-03-19 DIAGNOSIS — Z556 Problems related to health literacy: Secondary | ICD-10-CM | POA: Diagnosis not present

## 2023-03-19 DIAGNOSIS — I69320 Aphasia following cerebral infarction: Secondary | ICD-10-CM | POA: Diagnosis not present

## 2023-03-19 DIAGNOSIS — E785 Hyperlipidemia, unspecified: Secondary | ICD-10-CM | POA: Diagnosis not present

## 2023-03-19 DIAGNOSIS — I714 Abdominal aortic aneurysm, without rupture, unspecified: Secondary | ICD-10-CM | POA: Diagnosis not present

## 2023-03-19 DIAGNOSIS — E119 Type 2 diabetes mellitus without complications: Secondary | ICD-10-CM | POA: Diagnosis not present

## 2023-03-19 DIAGNOSIS — Z7982 Long term (current) use of aspirin: Secondary | ICD-10-CM | POA: Diagnosis not present

## 2023-03-19 DIAGNOSIS — Z7902 Long term (current) use of antithrombotics/antiplatelets: Secondary | ICD-10-CM | POA: Diagnosis not present

## 2023-03-19 DIAGNOSIS — G3184 Mild cognitive impairment, so stated: Secondary | ICD-10-CM | POA: Diagnosis not present

## 2023-03-24 ENCOUNTER — Other Ambulatory Visit (HOSPITAL_COMMUNITY): Payer: PPO

## 2023-03-24 ENCOUNTER — Ambulatory Visit: Payer: PPO | Admitting: Vascular Surgery

## 2023-03-31 ENCOUNTER — Ambulatory Visit (HOSPITAL_COMMUNITY)
Admission: RE | Admit: 2023-03-31 | Discharge: 2023-03-31 | Disposition: A | Discharge status: Home / Self Care | Payer: PPO | Source: Ambulatory Visit | Attending: Vascular Surgery | Admitting: Vascular Surgery

## 2023-03-31 ENCOUNTER — Encounter: Payer: Self-pay | Admitting: Vascular Surgery

## 2023-03-31 ENCOUNTER — Ambulatory Visit: Payer: PPO | Admitting: Vascular Surgery

## 2023-03-31 VITALS — BP 130/73 | HR 69 | Temp 97.9°F | Resp 18 | Ht 67.0 in | Wt 188.3 lb

## 2023-03-31 DIAGNOSIS — I6523 Occlusion and stenosis of bilateral carotid arteries: Secondary | ICD-10-CM | POA: Diagnosis not present

## 2023-03-31 DIAGNOSIS — I7143 Infrarenal abdominal aortic aneurysm, without rupture: Secondary | ICD-10-CM

## 2023-03-31 DIAGNOSIS — I779 Disorder of arteries and arterioles, unspecified: Secondary | ICD-10-CM | POA: Insufficient documentation

## 2023-03-31 NOTE — Progress Notes (Signed)
Patient name: Adam Shepherd MRN: [id number] DOB: 08-12-1945 Sex: male  REASON FOR CONSULT: 6 month f/u for 5.0 cm AAA  HPI: Adam Shepherd is a 77 y.o. male, with history of hypertension and hyperlipidemia who presents for 6 month follow-up for known abdominal aortic aneurysm.  He was last seen in our practice on 09/16/2022 with a 5.05 cm abdominal aortic aneurysm.  He denies any abdominal pain today.    Unfortunately was hospitalized at [facility] last month over Thanksgiving with a stroke.  States he had a stroke with some slurred speech and right-sided weakness.  Ultimately underwent a transfemoral left carotid stent at [facility].  He is fully recovered from his stroke.  Remains on aspirin Plavix.  Past Medical History:  Diagnosis Date   AAA (abdominal aortic aneurysm) (HCC)    Allergy    Chronic kidney disease    Degenerative joint disease    Diverticulosis    Hx of adenomatous colonic polyps 10/29/2015   Hyperlipidemia    Hypertension     Past Surgical History:  Procedure Laterality Date   CATARACT EXTRACTION Bilateral 2001   FINGER SURGERY Bilateral 1965   after traumatic injury   TONSILLECTOMY  88    Family History  Family history unknown: Yes    SOCIAL HISTORY: Social History   Socioeconomic History   Marital status: Married    Spouse name: Not on file   Number of children: Not on file   Years of education: Not on file   Highest education level: Not on file  Occupational History   Not on file  Tobacco Use   Smoking status: Former    Current packs/day: 0.00    Types: Cigarettes    Quit date: 11/07/1966    Years since quitting: 56.4   Smokeless tobacco: Never  Substance and Sexual Activity   Alcohol use: No    Alcohol/week: 0.0 standard drinks of alcohol   Drug use: No   Sexual activity: Not on file  Other Topics Concern   Not on file  Social History Narrative   Not on file   Social Drivers of Health   Financial Resource Strain: Not on file  Food  Insecurity: No Food Insecurity (03/16/2023)   [facility]   Hunger Vital Sign    Worried About Running Out of Food in the Last Year: Never true    Ran Out of Food in the Last Year: Never true  Transportation Needs: No Transportation Needs (03/16/2023)   Received from [facility]   PRAPARE - Administrator, Civil Service (Medical): No    Lack of Transportation (Non-Medical): No  Physical Activity: Not on file  Stress: Not on file  Social Connections: Not on file  Intimate Partner Violence: Not At Risk (03/09/2023)   Received from [facility]   Abuse Screen    Feels Unsafe at Home or Work/School: no    Feels Threatened by Someone: no    Does Anyone Try to Keep You From Having Contact with Others or Doing Things Outside Your Home?: no    Physical Signs of Abuse Present: no    No Known Allergies  Current Outpatient Medications  Medication Sig Dispense Refill   aspirin EC 81 MG tablet Take 81 mg by mouth daily.     atenolol (TENORMIN) 25 MG tablet Take 25 mg by mouth daily. (Patient not taking: Reported on 03/04/2022)  bisoprolol (ZEBETA) 10 MG tablet      clotrimazole-betamethasone (LOTRISONE) cream Apply 1 application topically 2 (two) times daily. Reported on 10/05/2015     COVID-19 mRNA bivalent vaccine, Moderna, (MODERNA COVID-19 BIVAL BOOSTER) 50 MCG/0.5ML injection Inject into the muscle. (Patient not taking: Reported on 09/16/2022) 0.5 mL 0   fluticasone (FLONASE) 50 MCG/ACT nasal spray Place 2 sprays into both nostrils daily as needed for allergies or rhinitis. Reported on 10/05/2015     influenza vaccine adjuvanted (FLUAD QUADRIVALENT) 0.5 ML injection Inject into the muscle. (Patient not taking: Reported on 03/04/2022) 0.5 mL 0   No current facility-administered medications for this visit.    REVIEW OF SYSTEMS:  [X]  denotes positive finding, [ ]  denotes negative  finding Cardiac  Comments:  Chest pain or chest pressure:    Shortness of breath upon exertion:    Short of breath when lying flat:    Irregular heart rhythm:        Vascular    Pain in calf, thigh, or hip brought on by ambulation:    Pain in feet at night that wakes you up from your sleep:     Blood clot in your veins:    Leg swelling:         Pulmonary    Oxygen at home:    Productive cough:     Wheezing:         Neurologic    Sudden weakness in arms or legs:     Sudden numbness in arms or legs:     Sudden onset of difficulty speaking or slurred speech:    Temporary loss of vision in one eye:     Problems with dizziness:         Gastrointestinal    Blood in stool:     Vomited blood:         Genitourinary    Burning when urinating:     Blood in urine:        Psychiatric    Major depression:         Hematologic    Bleeding problems:    Problems with blood clotting too easily:        Skin    Rashes or ulcers:        Constitutional    Fever or chills:      PHYSICAL EXAM: There were no vitals filed for this visit.   GENERAL: The patient is a well-nourished male, in no acute distress. The vital signs are documented above. CARDIAC: There is a regular rate and rhythm.  VASCULAR:  Palpable femoral pulses bilaterally PULMONARY: No respiratory distress. ABDOMEN: Soft and non-tender.  No pain with palpation of aneurysm. MUSCULOSKELETAL: There are no major deformities or cyanosis. NEUROLOGIC: No focal weakness or paresthesias are detected.  Cranial nerves II through XII grossly intact. SKIN: There are no ulcers or rashes noted. PSYCHIATRIC: The patient has a normal affect.  DATA:   AAA duplex today shows AAA 5.0 --> 4.9 cm  Assessment/Plan:  77 year old male presents for 6 month follow-up of known 5 cm abdominal aortic aneurysm.  I discussed there has been no growth in his aneurysm over the last 6 months.  The maximal diameter on duplex today was 4.9 cm.  I  discussed these do not shrink and ultimately his study indicates there has been no interval growth over the last 6 months.  I discussed we will repeat a duplex in 6 months.  I will also go ahead and  get a carotid ultrasound for a baseline study and a repeat carotid ultrasound in 6 months.  Patiently was recently hospitalized at [facility] over Thanksgiving with a stroke involving right-sided weakness and ultimately got a transfemoral left carotid stent.  Discussed with him and his wife that we are happy to do surveillance of that here in [place].  He is on aspirin Plavix started [facility] which is appropriate for dual antiplatelet therapy.   Cephus Shelling, MD [facility] [place] Office: [phone]

## 2023-04-01 ENCOUNTER — Other Ambulatory Visit: Payer: Self-pay | Admitting: *Deleted

## 2023-04-01 DIAGNOSIS — I6523 Occlusion and stenosis of bilateral carotid arteries: Secondary | ICD-10-CM

## 2023-04-02 ENCOUNTER — Other Ambulatory Visit: Payer: Self-pay

## 2023-04-02 ENCOUNTER — Ambulatory Visit (HOSPITAL_COMMUNITY)
Admission: RE | Admit: 2023-04-02 | Discharge: 2023-04-02 | Disposition: A | Discharge status: Home / Self Care | Payer: PPO | Source: Ambulatory Visit | Attending: Vascular Surgery | Admitting: Vascular Surgery

## 2023-04-02 DIAGNOSIS — I714 Abdominal aortic aneurysm, without rupture, unspecified: Secondary | ICD-10-CM

## 2023-04-02 DIAGNOSIS — I6523 Occlusion and stenosis of bilateral carotid arteries: Secondary | ICD-10-CM

## 2023-04-13 NOTE — Progress Notes (Signed)
 Initial neurology clinic note  Adam Shepherd MRN: [id number] DOB: 11/10/1945  Referring provider: Verena Mems, MD  Primary care provider: Verena Mems, MD  Reason for consult:  stroke  Subjective:  This is [name], a 77 y.o. 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 The patient is accompanied by wife.  Patient was driving in [place], [place] on 03/09/23. Wife noticed patient was driving poorly and not really using his right arm. He pulled over. Wife tested him for stroke and noticed when he tried to repeat a sentence, it was word salad. Wife is orthoptist. EMS took patient to hospital where [facility] was 2 (1 for facial asymmetry, 1 for language). He was into the ED within 40 minutes. CTH showed no acute process. CT perfusion showed a small mismatch in the left parieto-occipital lobe (mismatch volume of 25 mL). CTA head and neck showed focal occlusion of distal left M3. TNK was administered.  He then had vision changes and right sided weakness on day 2 during routine neurologic testing. He went for angio and got a left ICA stent. Patient was started on ASA 325 mg and Plavix 75 mg. The plan was to continue DAPT for at least 3 months followed by asa monotherapy. He is currently on aspirin 81 mg daily due to stomach issues.   He was also started on Crestor 40 mg and Zetia 10 mg daily. 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  He was discharged on 03/16/23. At discharge, he had a little word finding difficulty. He was discharged with PT/OT and speech therapy, which has been completed.   Currently, he has occasional mild word finding difficulty if he is trying to speak to fast. His memory has been much better since even before the stroke. Wife thinks he is doing very well.  Patient  continues to work, as a hydrographic surveyor. He is currently having no difficulties at work.    MEDICATIONS:  Outpatient Encounter Medications as of 04/22/2023  Medication Sig   aspirin EC 81 MG tablet Take 81 mg by mouth daily.   atenolol (TENORMIN) 25 MG tablet Take 25 mg by mouth daily. (Patient not taking: Reported on 03/04/2022)   bisoprolol (ZEBETA) 10 MG tablet    cholecalciferol (VITAMIN D3) 25 MCG (1000 UNIT) tablet Take 1,000 Units by mouth daily.   clopidogrel (PLAVIX) 75 MG tablet Take 1 tablet by mouth daily.   clotrimazole-betamethasone (LOTRISONE) cream Apply 1 application topically 2 (two) times daily. Reported on 10/05/2015   colchicine 0.6 MG tablet Take 0.6 mg by mouth 2 (two) times daily as needed.   COVID-19 mRNA bivalent vaccine, Moderna, (MODERNA COVID-19 BIVAL BOOSTER) 50 MCG/0.5ML injection Inject into the muscle. (Patient not taking: Reported on 03/31/2023)   ezetimibe (ZETIA) 10 MG tablet Take 1 tablet by mouth daily.   fluticasone (FLONASE) 50 MCG/ACT nasal spray Place 2 sprays into both nostrils daily as needed for allergies or rhinitis. Reported on 10/05/2015   influenza vaccine adjuvanted (FLUAD  QUADRIVALENT) 0.5 ML injection Inject into the muscle. (Patient not taking: Reported on 03/31/2023)   Multiple Vitamin (MULTIVITAMIN) tablet Take 1 tablet by mouth daily.   tamsulosin (FLOMAX) 0.4 MG CAPS capsule Take 0.4 mg by mouth daily.   No facility-administered encounter medications on file as of 04/22/2023.    PAST  MEDICAL HISTORY: Past Medical History:  Diagnosis Date   AAA (abdominal aortic aneurysm) (HCC)    Allergy    Carotid artery occlusion    Chronic kidney disease    Degenerative joint disease    Diverticulosis    Hx of adenomatous colonic polyps 10/29/2015   Hyperlipidemia    Hypertension    Stroke (HCC) 03/09/2023   Ischemic stroke affecting the circle of willis    PAST SURGICAL HISTORY: Past Surgical History:  Procedure Laterality Date    carotid surgery Left 03/13/2023   Angio seal vascular device placement Left Carotid artery   CATARACT EXTRACTION Bilateral 04/15/1999   FINGER SURGERY Bilateral 04/15/1963   after traumatic injury   TONSILLECTOMY  04/15/1955    ALLERGIES: No Known Allergies  FAMILY HISTORY: Family History  Family history unknown: Yes    SOCIAL HISTORY: Social History   Tobacco Use   Smoking status: Former    Current packs/day: 0.00    Types: Cigarettes    Quit date: 11/07/1966    Years since quitting: 56.4   Smokeless tobacco: Never  Vaping Use   Vaping status: Never Used  Substance Use Topics   Alcohol use: No    Alcohol/week: 0.0 standard drinks of alcohol   Drug use: No   Social History   Social History Narrative   Are you right handed or left handed? Right   Are you currently employed ?    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   Who lives with you? wife   What type of home do you live in: 1 story or 2 story? one    Caffiene 3 cups a day to 2 a day    Objective:  Vital Signs:  BP (!) 152/60 (Cuff Size: Large)   Pulse 90   Ht 5' 7 (1.702 m)   Wt 192 lb (87.1 kg)   SpO2 96%   BMI 30.07 kg/m   General: No acute distress.  Patient appears well-groomed.   Head:  Normocephalic/atraumatic Neck: supple, full range of motion Heart: regular rate and rhythm Lungs: Clear to auscultation bilaterally. Vascular: No carotid bruits.  Neurological Exam: Mental status: alert and oriented, speech fluent and not dysarthric, Able to name. Very mild word order or word substitution errors when repeating  Cranial nerves: CN I: not tested CN II: pupils equal, round and reactive to light, visual fields intact CN III, IV, VI:  full range of motion, no nystagmus, no ptosis CN V: facial sensation intact. CN VII: upper and lower face symmetric CN VIII: hearing intact CN IX, X: uvula midline CN XI: sternocleidomastoid and trapezius muscles intact CN  XII: tongue midline  Bulk & Tone: normal, no fasciculations. Motor:  muscle strength 5/5 throughout Deep Tendon Reflexes:  2+ throughout.   Sensation:  Pinprick sensation intact. Finger to nose testing:  Without dysmetria.   Gait:  Normal station and stride.   Labs and Imaging review: External labs: Stroke Laboratory Studies Lab Results  Component Value Date  HGBA1C 6.1 (H) 03/09/2023  VITAMINB12 655 03/09/2023  FOLATE 11.9 03/09/2023  TSH 1.86 03/09/2023   Lab Results  Component Value Date  CHOL 193 03/13/2023  CHOL 210 (H) 03/09/2023   Lab Results  Component Value Date  HDL 32 (L) 03/13/2023  HDL 29 (L) 03/09/2023   Lab Results  Component Value Date  LDLCHOCALBLD 115 (H) 03/13/2023  LDLCHOCALBLD 03/09/2023  Comment:  CALCULATED LDL NOT AVAILABLE DUE TO TRIGLYCERIDE  VALUE >400. ORDER LDL CHOLESTEROL, DIRECT (LAB102) IF DESIRED. Desirable: <100 mg/dL Above Desirable: 899-870 mg/dL Borderline High: 869-840 mg/dL High: 839-810 mg/dL Very High: >=809 mg/dL   Lab Results  Component Value Date  TRIG 232 (H) 03/13/2023  TRIG 414 (H) 03/09/2023   Carotid ultrasound (internal - 04/02/23): Summary:  Right Carotid: Velocities in the right ICA are consistent with a 1-39%  stenosis.   Left Carotid: Patent stent without evidence of stenosis.   Vertebrals:  Bilateral vertebral arteries demonstrate antegrade flow.  Subclavians: Normal flow hemodynamics were seen in bilateral subclavian arteries.   Imaging (external): FINDINGS:  PRECONTRAST HEAD CT:  There is no evidence of acute intracranial hemorrhage. There is no mass, significant mass effect, or abnormal extra-axial collection. The basal cisterns are patent.  The ventricles are normal in size.  The density of the dural venous sinuses is normal.  The skull base and calvarium are normal.  Scattered opacification of the paranasal sinuses.  CT PERFUSION:   Area of increased time to peak and MTT in the left  parieto-occipital lobe with decreased blood flow and preserved blood volume. Rapid AI calculates a perfusion defect of 25 mL.   NECK CTA:   The imaged aortic arch is normal. The origins of the brachiocephalic, bilateral common carotid, bilateral subclavian arteries demonstrate no significant stenosis. Atherosclerosis of the origins of the bilateral vertebral arteries.  Normal bilateral common carotid arteries.  Atherosclerotic disease of the right carotid bifurcation and the proximal right internal carotid artery without significant stenosis by NASCET-like criteria.   Atherosclerotic disease of the left carotid bifurcation and the proximal left internal carotid artery without significant stenosis by NASCET-like criteria.   Scattered atherosclerotic disease of the bilateral cervical vertebral arteries without significant stenosis. Right dominant vertebral artery.  Shotty appearing neck lymph nodes.  Calcified left upper lobe granulomas. Nonspecific diffuse groundglass densities with areas of bronchiectasis.  Multilevel degenerative changes of the cervical spine without evidence of aggressive osseous lesion.  HEAD CTA:  Atherosclerotic disease of the cavernous, and supraclinoid segments of the bilateral internal carotid arteries (ICAs) without significant stenosis. The petrous segment is also without significant stenosis.   Occlusion of the distal left M3 branch (series 11 image 140). Normal vertebral, basilar, and posterior cerebral arteries.  No saccular aneurysm.   Impression  IMPRESSION:   CT Brain: No acute intracranial process. Specifically, no evidence of acute hemorrhage.  CT Perfusion: Area of small mismatch perfusion in the left parieto-occipital lobe. Rapid AI calculates a mismatch volume of 25 mL.   CTA Head: Focal occlusion of the distal left M3 artery which correlates with area of mismatch perfusion defect in the left parieto-occipital lobe.  CTA Neck:  No significant arterial stenosis.  CRITICAL RESULTS: New or unsuspected acute brain infarct  Venetia Sprague MD became aware of the critical finding at 03/09/2023 6:05 PM. Key results discussed with Dr. Gwenn at 03/09/2023 6:17 PM.   ADDITIONAL ATTENDING COMMENTS: Agree. With regards to the proximal left cervical ICA atherosclerotic disease, there is mixed calcified and noncalcified atheroma which results in a approximately 56% stenosis per NASCET-like criteria. It should be noted that the prominent hypodense atheroma has an irregular luminal border (for example axial CTA series 11 image 338) indicating instability and likely the embolic source of the current left M3 occlusion.  Dictated by: Venetia Sprague, MD. 03/09/2023 6:21 PM  I, Camellia Buddle, DO, have reviewed the study and agree with the findings in this report. 03/09/2023 7:07 PM  CT Head Wo  Contrast  Narrative  EXAMINATION: CT HEAD WO CONTRAST 03/10/2023 6:00 PM  ACCESSION NUMBER: [id number]  INDICATION: Post TNK. 24 HR post TNK, 24 HR post TNK.  COMPARISON: CT brain attack 03/09/2023  TECHNIQUE: Thinly collimated spiral CT images of the brain without contrast with axial, coronal and sagittal reformations.   RADIMETRICS DOSE: Scanner: [id number] CTDIvol: 38.2 DLP: 743 mGy-cm.  FINDINGS:  Developing hypoattenuating area in the left parietal occipital lobe corresponding with mismatch perfusion on recent brain attack. The basal cisterns are patent.  The ventricles are normal in size.  The density of the dural venous sinuses is normal.  The skull base and calvarium are normal.  Scattered mucosal thickening of the paranasal sinuses. The visualized orbits are unremarkable.  Impression  IMPRESSION:   Hypoattenuating area consistent with acute infarct in the left parietal occipital lobe corresponding with perfusion abnormality on brain attack imaging. No evidence of intracranial hemorrhage.  Dictated by: Arvella Arts, MD.  03/10/2023 6:06 PM  I, Hadassah Loel Dimitri, MD, have reviewed the study and agree with the findings in this report. 03/11/2023 3:00 PM  NES Embolization Cranial/Spinal  Addendum: 03/15/2023  Please note, left vert does not come off the arch. This was reported in error.    Narrative  INDICATION: 60 oy M presented 11/25 with acute onset of difficulty getting  words out. CT Batt with L M3 occlusion and L ICA stenosis. Nsgy consulted  for possible L ICA stent. Patient with aphasia. While stenosis was only  55%, felt to be irregular plaque and potential source of embolic stroke.   COMPARISON: CTA  PROCEDURE: Stenting of left internal carotid artery with embolic  protection.   ATTENDING: Dr. Kicielinski, the attending and teaching physician, was  present and participated in the procedure.  ASSISTANTS: Delwin Romp  ANESTHESIA: Conscious Sedation (by General Anesthesia)   TRANSFEMORAL TECHNIQUE: After the risks, benefits, treatment options and  complications were discussed with the patient and family and they agreed  to proceed, the patient was brought to the angiography suite. Time out was  performed. The right groin region was prepped and draped in the usual  sterile fashion.Ultrasound guidance was used to select a site for access  in the right common femoral artery. The skin at the entry site was  anesthetized using 10cc of lidocaine. The micropuncture kit was used and a  5 french sheath was placed over a 0.035 PTFE floppy wire in the right  common femoral artery using Seldinger technique with imaging of the common  iliac artery confirming sheath placement. Utrasound guidance and  micropuncture technique were utilized for access.   Patient received a bolus of 5000 units of heparin. Baseline and serial  ACTs were obtained and the patient was redosed as necessary to maintain a  level between 200 and 250.   A Neuron Max, and a Bernie insert in conjunction with a 0.038  angled  guidewire were used to selectively catheterize the right common carotid  artery, left vertebral artery, left common carotid artery (sim insert for  LCCA). After each vessel was selected multiple AP, lateral, oblique and  magnified angiographic runs were performed with filming over the head and  neck.   EMBOLIZATION: A Neuron Max, 071 catheter with a Sim insert in conjunction with a 0.038  angled guidewire were used to selectively catheterize the left common  carotid artery at which point the guide was advanced just proximal to the  bifurcation. The insert and guidewire were removed and the guide connected  to continuous heparinized flush as well as the power injector. Hand  injected angiography was performed for a control head run. We next  navigated a Synchro Select through a Spider FX rapid exchange system to  the petrous segment of the internal carotid where we deployed the 4mm  SpiderFX. Next we navigated a 5x34mm Precise stent across the stenotic  segment of the internal carotid artery. The stent was deployed under live  fluoroscopic visualization without complication. Hand injected angiography  performed which demonstrated appropriate stent placement without  complication. The SpiderFx was then recaptured. Angiography was again  performed which demonstrated appropriate deployment of the stent, good  wall apposition with no evidence of in stent stenosis or thrombosis. Head  run was performed showing distal vessels filled and cleared with same rate  as prior to embolization of the aneurysm without evidence of complication.  All catheters were removed. Hemostasis was achieved with Angioseal.    IMPLANTS:  PRECISE PRO RX 5 mm x 40 mm.    FINDINGS: RIGHT FEMORAL ARTERY: There is no evidence of iatrogenic injury. Access  site is above the bifurcation and appropriate for closure device.   RIGHT COMMON CAROTID ARTERY: The carotid artery bifurcation is at the C3/4  level.  The carotid bifurcation is widely patent without atherosclerotic  disease or hemodynamic significant stenosis. The cervical internal and  external carotid arteries are of normal course and caliber.  RIGHT COMMON CAROTID ARTERY:There are normal arterial, capillary and  venous phases. The posterior communicating artery is small, fills the  posterior cerebral artery and rapidly clears from competitive flow. The  contralateral anterior cerebral artery flash fills via the anterior  communicating artery. No evidence of vasculitis, aneurysm, branch block  or AV shunting is seen. There are no areas with absent capillary blush.  Major venous sinuses are patent.  LEFT VERTEBRAL ARTERY: There are normal arterial, capillary and venous  phases. Bilateral posterior communicating arteries fill and rapidly clear  from competitive flow. The right distal vertebral artery does not  backfill. No evidence of vasculitis, aneurysm, branch block or AV shunting  is seen. There are no areas with absent capillary blush. Major venous  sinuses are patent. Left vertebral artery arises directly from the aortic  arch  LEFT COMMON CAROTID ARTERY: The carotid artery bifurcation is at the C3/4  level. The carotid bifurcation is patent but the origin of the cervical  internal carotid artery is very stenotic with significant plaque. The  cervical internal and external carotid arteries are of normal course  otherwise.  LEFT COMMON CAROTID ARTERY (PRE-STENTING): Internal carotid artery was  visualized via hand injection at the level of the common. There is severe  stenosis at the origin secondary to a thrombus. There are normal arterial,  capillary and venous phases. The posterior communicating artery is small,  fills the posterior cerebral artery and rapidly clears from competitive  flow. The contralateral anterior cerebral artery flash fills via the  anterior communicating artery. No evidence of vasculitis, aneurysm,   branch block or AV shunting is seen. There are no areas with absent  capillary blush. Major venous sinuses are patent.  LEFT COMMON CAROTID ARTERY: Interval advancement of guide catheter to the  common carotid artery bifurcation. Hand injected angiography demonstrates  no evidence of iatrogenic injury. Control run of the head unchanged from  prior imaging.   LEFT COMMON CAROTID ARTERY (POST STENTING): Interval placement of  anti-embolic device into the petrous segment of the internal carotid  artery and stenting  of the carotid bifurcation. Angiography was again  performed which demonstrated appropriate deployment of the stent, good  wall apposition with no evidence of in stent stenosis or thrombosis  LEFT COMMON CAROTID ARTERY (CONTROL RUN): Interval removal of anti-embolic  device. Control head run through the guide without evidence of  complication as compared to prior run.    Impression  :  Successful stenting of the left internal carotid artery without  complication.   ASA plavix responder  L vert arises directly from arch  Assessment/Plan:  ROMELO SCIANDRA is a 77 y.o. male who presents for evaluation of stroke s/p TNK and LICA stent in 02/2023. 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 His neurological examination is pertinent for very mild aphasia with difficulty most noted on repeating. Patient was recommended to continue DAPT for at least 3 months with follow up CTA then possible transition to aspirin or plavix monotherapy. 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 In terms of stroke recovery, patient has done really well, finishing PT/OT and speech therapy and has only very mild word finding difficulty as a deficit.  PLAN: -Repeat CTA head and neck in 3 months (~06/2023), if okay, will transition from DAPT to aspirin 81 mg vs  plavix 75 mg monotherapy -Continue aspirin 81 mg daily and Plavix 75 mg daily for at least 3 months -Continue Crestor 40 mg and Zetia 10 mg daily -Discussed stroke warning signs  -Return to clinic in 6 months  The impression above as well as the plan as outlined below were extensively discussed with the patient (in the company of wife) who voiced understanding. All questions were answered to their satisfaction.  When available, results of the above investigations and possible further recommendations will be communicated to the patient via telephone/MyChart. Patient to call office if not contacted after expected testing turnaround time.   Total time spent reviewing records, interview, history/exam, documentation, and coordination of care on day of encounter:  60 min   Thank you for allowing me to participate in patient's care.  If I can answer any additional questions, I would be pleased to do so.  Venetia Potters, MD   CC: Verena Mems, MD [address] [place] [address]  CC: Referring provider: Verena Mems, MD [address] [place],  [place] [address]

## 2023-04-22 ENCOUNTER — Ambulatory Visit: Payer: PPO | Admitting: Neurology

## 2023-04-22 ENCOUNTER — Encounter: Payer: Self-pay | Admitting: Neurology

## 2023-04-22 VITALS — BP 152/60 | HR 90 | Ht 67.0 in | Wt 192.0 lb

## 2023-04-22 DIAGNOSIS — I6522 Occlusion and stenosis of left carotid artery: Secondary | ICD-10-CM | POA: Diagnosis not present

## 2023-04-22 DIAGNOSIS — I10 Essential (primary) hypertension: Secondary | ICD-10-CM

## 2023-04-22 DIAGNOSIS — I63232 Cerebral infarction due to unspecified occlusion or stenosis of left carotid arteries: Secondary | ICD-10-CM

## 2023-04-22 DIAGNOSIS — E785 Hyperlipidemia, unspecified: Secondary | ICD-10-CM

## 2023-04-22 NOTE — Patient Instructions (Addendum)
 I saw you today for stroke that you had on 03/09/23. You are recovering well with minimal deficits.  I want to repeat the CT angiogram of the blood vessels in your head and neck as recommended around 06/13/2023. I am ordering this today. When you are called to schedule, please make sure it is scheduled at the end of 05/2023 or beginning of 06/2023.   I will be in touch when I have the results and we'll discuss what changes need to occur with your medications.  -Continue aspirin 81 mg daily and Plavix 75 mg daily for now -Continue Crestor 40 mg and Zetia 10 mg daily  I will see you back in clinic in about 6 months or sooner if needed.  If you have new difficulty speaking, face droop, numbness on one side of the body, weakness on one side of the body, or dizziness/imbalance, this could be the sign of a stroke. Don't wait, please call EMS and be evaluated at the nearest emergency room.  The physicians and staff at [facility] are committed to providing excellent care. You may receive a survey requesting feedback about your experience at our office. We strive to receive very good responses to the survey questions. If you feel that your experience would prevent you from giving the office a very good  response, please contact our office to try to remedy the situation. We may be reached at [phone]. Thank you for taking the time out of your busy day to complete the survey.  Venetia Potters, MD [facility]

## 2023-05-11 DIAGNOSIS — R972 Elevated prostate specific antigen [PSA]: Secondary | ICD-10-CM | POA: Diagnosis not present

## 2023-05-21 LAB — LAB REPORT - SCANNED
A1c: 6.9
EGFR: 72

## 2023-05-29 ENCOUNTER — Telehealth: Payer: Self-pay | Admitting: Neurology

## 2023-05-29 NOTE — Telephone Encounter (Signed)
Returned call to Dr. Lorin Picket at [facility] regarding patient. 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 Dr. Lorin Picket was calling to be sure we were aware.   Patient is on DAPT and had stent in LICA, so these findings are unfortunate, but no unexpected.  Jacquelyne Balint, MD [facility]

## 2023-05-29 NOTE — Telephone Encounter (Signed)
Adam Shepherd called from [facility] office calling as PT's CVA came back with plaque on arteries of eyes, would like to chat Provider to provider or Provider to Nurse.

## 2023-06-08 DIAGNOSIS — E1169 Type 2 diabetes mellitus with other specified complication: Secondary | ICD-10-CM | POA: Diagnosis not present

## 2023-06-12 ENCOUNTER — Ambulatory Visit
Admission: RE | Admit: 2023-06-12 | Discharge: 2023-06-12 | Disposition: A | Discharge status: Home / Self Care | Payer: PPO | Source: Ambulatory Visit | Attending: Neurology | Admitting: Neurology

## 2023-06-12 ENCOUNTER — Other Ambulatory Visit: Payer: Self-pay | Admitting: Neurology

## 2023-06-12 DIAGNOSIS — E785 Hyperlipidemia, unspecified: Secondary | ICD-10-CM

## 2023-06-12 DIAGNOSIS — I6522 Occlusion and stenosis of left carotid artery: Secondary | ICD-10-CM

## 2023-06-12 DIAGNOSIS — I10 Essential (primary) hypertension: Secondary | ICD-10-CM

## 2023-06-12 DIAGNOSIS — I63232 Cerebral infarction due to unspecified occlusion or stenosis of left carotid arteries: Secondary | ICD-10-CM

## 2023-06-12 MED ORDER — IOPAMIDOL (ISOVUE-370) INJECTION 76%
500.0000 mL | Freq: Once | INTRAVENOUS | Status: AC | PRN
  Administered 2023-06-12: 150 mL via INTRAVENOUS

## 2023-06-15 ENCOUNTER — Telehealth: Payer: Self-pay | Admitting: Neurology

## 2023-06-15 NOTE — Telephone Encounter (Signed)
 Attempted to call to discuss results of CTA head and neck. Left message asking for call back.   Jacquelyne Balint, MD [facility]

## 2023-06-16 ENCOUNTER — Encounter: Payer: Self-pay | Admitting: Neurology

## 2023-06-26 DIAGNOSIS — L089 Local infection of the skin and subcutaneous tissue, unspecified: Secondary | ICD-10-CM | POA: Diagnosis not present

## 2023-06-26 DIAGNOSIS — R040 Epistaxis: Secondary | ICD-10-CM | POA: Diagnosis not present

## 2023-07-01 DIAGNOSIS — R040 Epistaxis: Secondary | ICD-10-CM | POA: Diagnosis not present

## 2023-07-01 DIAGNOSIS — M546 Pain in thoracic spine: Secondary | ICD-10-CM | POA: Diagnosis not present

## 2023-08-03 DIAGNOSIS — J019 Acute sinusitis, unspecified: Secondary | ICD-10-CM | POA: Diagnosis not present

## 2023-09-22 ENCOUNTER — Ambulatory Visit: Payer: PPO | Attending: Vascular Surgery | Admitting: Vascular Surgery

## 2023-09-22 ENCOUNTER — Encounter: Payer: Self-pay | Admitting: Vascular Surgery

## 2023-09-22 ENCOUNTER — Ambulatory Visit (HOSPITAL_COMMUNITY)
Admission: RE | Admit: 2023-09-22 | Discharge: 2023-09-22 | Disposition: A | Discharge status: Home / Self Care | Payer: PPO | Source: Ambulatory Visit | Attending: Vascular Surgery | Admitting: Vascular Surgery

## 2023-09-22 ENCOUNTER — Ambulatory Visit (HOSPITAL_COMMUNITY)
Admission: RE | Admit: 2023-09-22 | Discharge: 2023-09-22 | Disposition: A | Discharge status: Home / Self Care | Payer: PPO | Source: Ambulatory Visit | Attending: Vascular Surgery

## 2023-09-22 VITALS — BP 115/73 | HR 67 | Temp 98.3°F | Resp 18 | Ht 67.0 in | Wt 194.1 lb

## 2023-09-22 DIAGNOSIS — I714 Abdominal aortic aneurysm, without rupture, unspecified: Secondary | ICD-10-CM | POA: Diagnosis not present

## 2023-09-22 DIAGNOSIS — I7143 Infrarenal abdominal aortic aneurysm, without rupture: Secondary | ICD-10-CM | POA: Diagnosis not present

## 2023-09-22 DIAGNOSIS — I6523 Occlusion and stenosis of bilateral carotid arteries: Secondary | ICD-10-CM

## 2023-09-22 NOTE — Progress Notes (Signed)
 Patient name: Adam Shepherd MRN: [id number] DOB: [birth year] Sex: male  REASON FOR CONSULT: 6 month f/u for 4.9 cm AAA and also carotid artery surveillance  HPI: Adam Shepherd is a 78 y.o. male, with history of hypertension and hyperlipidemia who presents for 6 month follow-up for known abdominal aortic aneurysm.  He was last seen in our practice on 03/31/2023 with a 4.9 cm AAA.  Unfortunately was hospitalized at [facility] over Thanksgiving 2024 with a stroke.  States he had a stroke with some slurred speech and right-sided weakness.  Ultimately underwent a transfemoral left carotid stent at [facility].    Today his wife states that his PCP took him off aspirin but he remains on Plavix.  This was due to what sounds like gastritis.  NO new stroke symptoms.  No abdominal or back pain.  Past Medical History:  Diagnosis Date   AAA (abdominal aortic aneurysm) (HCC)    Allergy    Carotid artery occlusion    Chronic kidney disease    Degenerative joint disease    Diverticulosis    Hx of adenomatous colonic polyps 10/29/2015   Hyperlipidemia    Hypertension    Stroke (HCC) 03/09/2023   Ischemic stroke affecting the circle of [place]    Past Surgical History:  Procedure Laterality Date   carotid surgery Left 03/13/2023   Angio seal vascular device placement Left Carotid artery   CATARACT EXTRACTION Bilateral 04/15/1999   FINGER SURGERY Bilateral 04/15/1963   after traumatic injury   TONSILLECTOMY  04/15/1955    Family History  Family history unknown: Yes    SOCIAL HISTORY: Social History   Socioeconomic History   Marital status: Married    Spouse name: Not on file   Number of children: Not on file   Years of education: Not on file   Highest education level: Not on file  Occupational History   Not on file  Tobacco Use   Smoking status: Former    Current packs/day: 0.00    Types: Cigarettes    Quit date: 11/07/1966    Years since quitting: 56.9   Smokeless tobacco: Never   Vaping Use   Vaping status: Never Used  Substance and Sexual Activity   Alcohol use: No    Alcohol/week: 0.0 standard drinks of alcohol   Drug use: No   Sexual activity: Not on file  Other Topics Concern   Not on file  Social History Narrative   Are you right handed or left handed? Right   Are you currently employed ?    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   Who lives with you? wife   What type of home do you live in: 1 story or 2 story? one    Caffiene 3 cups a day to 2 a day   Social Drivers of Corporate investment banker Strain: Not on file  Food Insecurity: No Food Insecurity (03/16/2023)   Received from [facility]    Hunger Vital Sign    Worried About Running Out of Food in the Last Year: Never true    Ran Out of Food in the Last Year: Never true  Transportation Needs: No Transportation Needs (03/16/2023)   [facility]    Lack of Transportation (Medical): No    Lack of Transportation (Non-Medical): No  Physical Activity: Not on file  Stress: Not  on file  Social Connections: Not on file  Intimate Partner Violence: Not At Risk (03/09/2023)   Received from [facility]    Abuse Screen    Feels Unsafe at Home or Work/School: no    Feels Threatened by Someone: no    Does Anyone Try to Keep You From Having Contact with Others or Doing Things Outside Your Home?: no    Physical Signs of Abuse Present: no    No Known Allergies  Current Outpatient Medications  Medication Sig Dispense Refill   bisoprolol (ZEBETA) 10 MG tablet Take 10 mg by mouth daily.     cholecalciferol (VITAMIN D3) 25 MCG (1000 UNIT) tablet Take 1,000 Units by mouth daily.     clopidogrel (PLAVIX) 75 MG tablet Take 75 mg by mouth daily.     colchicine 0.6 MG tablet Take 0.6 mg by mouth 2 (two) times daily as needed.     ezetimibe (ZETIA) 10 MG tablet  Take 10 mg by mouth daily.     fluticasone (FLONASE) 50 MCG/ACT nasal spray Place 2 sprays into both nostrils daily as needed for allergies or rhinitis. Reported on 10/05/2015     Multiple Vitamin (MULTIVITAMIN) tablet Take 1 tablet by mouth daily.     rosuvastatin (CRESTOR) 40 MG tablet Take 40 mg by mouth daily.     valsartan (DIOVAN) 160 MG tablet Take 160 mg by mouth daily.     aspirin EC 81 MG tablet Take 81 mg by mouth daily. (Patient not taking: Reported on 09/22/2023)     bisoprolol (ZEBETA) 10 MG tablet  (Patient not taking: Reported on 09/22/2023)     ezetimibe (ZETIA) 10 MG tablet Take 1 tablet by mouth daily.     tamsulosin (FLOMAX) 0.4 MG CAPS capsule Take 0.4 mg by mouth daily. (Patient not taking: Reported on 09/22/2023)     No current facility-administered medications for this visit.    REVIEW OF SYSTEMS:  [X]  denotes positive finding, [ ]  denotes negative finding Cardiac  Comments:  Chest pain or chest pressure:    Shortness of breath upon exertion:    Short of breath when lying flat:    Irregular heart rhythm:        Vascular    Pain in calf, thigh, or hip brought on by ambulation:    Pain in feet at night that wakes you up from your sleep:     Blood clot in your veins:    Leg swelling:         Pulmonary    Oxygen at home:    Productive cough:     Wheezing:         Neurologic    Sudden weakness in arms or legs:     Sudden numbness in arms or legs:     Sudden onset of difficulty speaking or slurred speech:    Temporary loss of vision in one eye:     Problems with dizziness:         Gastrointestinal    Blood in stool:     Vomited blood:         Genitourinary    Burning when urinating:     Blood in urine:        Psychiatric    Major depression:         Hematologic    Bleeding problems:    Problems with blood clotting too easily:        Skin    Rashes or  ulcers:        Constitutional    Fever or chills:      PHYSICAL EXAM: Vitals:   09/22/23  1125 09/22/23 1127  BP: 137/63 115/73  Pulse: 67   Resp: 18   Temp: 98.3 F (36.8 C)   TempSrc: Temporal   SpO2: 94%   Weight: 194 lb 1.6 oz (88 kg)   Height: 5\' 7"  (1.702 m)      GENERAL: The patient is a well-nourished male, in no acute distress. The vital signs are documented above. CARDIAC: There is a regular rate and rhythm.  VASCULAR:  Palpable femoral pulses bilaterally Palpable DP pulses bilaterally PULMONARY: No respiratory distress. ABDOMEN: Soft and non-tender.  No pain with palpation of aneurysm. MUSCULOSKELETAL: There are no major deformities or cyanosis. NEUROLOGIC: No focal weakness or paresthesias are detected.  Cranial nerves II through XII grossly intact. SKIN: There are no ulcers or rashes noted. PSYCHIATRIC: The patient has a normal affect.  DATA:   AAA duplex today shows AAA 4.6 cm in maximal diameter (previousy 4.9 cm)   Carotid duplex today shows minimal 1 to 39% right ICA stenosis and a patent left ICA stent with no stenosis  Assessment/Plan:  78 year old male presents for 6 month follow-up of known 4.9 cm abdominal aortic aneurysm.  I discussed there has been no growth in his aneurysm over the last 6 months.  The maximal diameter on duplex today was 4.6 cm.  I discussed these do not shrink and ultimately his study indicates there has been no interval growth over the last 6 months.  I discussed we will repeat a duplex in 6 months.  Discussed in men we repair AAA at greater than 5.5 cm.    Carotid duplex today shows his left carotid stent is widely patent.  He has minimal 1 to 39% stenosis on the right.  Will arrange follow-up in 6 months with carotid duplex when he is due for aneurysm surveillance.  I have asked that he stay on Plavix and statin for stent patency.  His PCP stopped the aspirin for what sounds like gastritis.   Young Hensen, MD [facility] Office: [phone]

## 2023-09-23 ENCOUNTER — Other Ambulatory Visit: Payer: Self-pay

## 2023-09-23 DIAGNOSIS — I6523 Occlusion and stenosis of bilateral carotid arteries: Secondary | ICD-10-CM

## 2023-09-23 DIAGNOSIS — I7143 Infrarenal abdominal aortic aneurysm, without rupture: Secondary | ICD-10-CM

## 2023-10-12 NOTE — Progress Notes (Signed)
 NEUROLOGY FOLLOW UP OFFICE NOTE  Adam Shepherd [id number]  Subjective:  Adam Shepherd is a 78 y.o. 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  To briefly review: 04/22/23: Patient was driving in [place], [place] on 03/09/23. Adam Shepherd noticed patient was driving poorly and not really using his right arm. He pulled over. Adam Shepherd tested him for stroke and noticed when he tried to repeat a sentence, it was word salad. Adam Shepherd is Orthoptist. EMS took patient to hospital where [facility] was 2 (1 for facial asymmetry, 1 for language). He was into the ED within 40 minutes. CTH showed no acute process. CT perfusion showed a small mismatch in the left parieto-occipital lobe (mismatch volume of 25 mL). CTA head and neck showed focal occlusion of distal left M3. TNK was administered.   He then had vision changes and right sided weakness on day 2 during routine neurologic testing. He went for angio and got a left ICA stent. Patient was started on ASA 325 mg and Plavix 75 mg. The plan was to continue DAPT for at least 3 months followed by asa monotherapy. He is currently on aspirin 81 mg daily due to stomach issues.    He was also started on Crestor 40 mg and Zetia 10 mg daily. 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   He was discharged on 03/16/23. At discharge, he had a little word finding difficulty. He was discharged with PT/OT and speech therapy, which has been completed.    Currently, he has occasional mild word finding difficulty if he is trying to speak too fast. His memory has been much better since even before the stroke. Adam Shepherd thinks he is doing very well.   Patient continues to work, as a Hydrographic surveyor. He is currently having no difficulties at work.   Most recent Assessment and Plan (04/22/23): Adam Shepherd  is a 78 y.o. male who presents for evaluation of stroke s/p TNK and LICA stent in 02/2023. 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 His neurological examination is pertinent for very mild aphasia with difficulty most noted on repeating. Patient was recommended to continue DAPT for at least 3 months with follow up CTA then possible transition to aspirin or plavix monotherapy. 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 In terms of stroke recovery, patient has done really well, finishing PT/OT and speech therapy and has only very mild word finding difficulty as a deficit.   PLAN: -Repeat CTA head and neck in 3 months (~06/2023), if okay, will transition from DAPT to aspirin 81 mg vs plavix 75 mg monotherapy -Continue aspirin 81 mg daily and Plavix 75 mg daily for at least 3 months -Continue Crestor 40 mg and Zetia 10 mg daily -Discussed stroke warning signs  Since their last visit: I was called by Dr. Glendia at [facility] on 05/29/23. Per that conversation, there were 6 plaques in 2 different vessels of the left retina. Dr. Glendia wanted to make sure we knew. Unfortunately, I explained this was expected given known left ICA stenosis s/p stent. CTA head and neck repeat looked good, so I wanted to speak to patient about medications. I left a  message and sent a MyChart without return message or call.  He still has occasional mild word finding difficulty. He denies any other symptoms currently.  Patient is now only on Plavix 75 mg daily. He stopped aspirin about 6 months ago per his report. He continues to take Crestor and Zetia.  MEDICATIONS:  Outpatient Encounter Medications as of 10/22/2023  Medication Sig   bisoprolol (ZEBETA) 10 MG tablet Take 10 mg by mouth daily.   cholecalciferol (VITAMIN D3) 25 MCG (1000 UNIT) tablet Take 1,000 Units by mouth daily.    clopidogrel (PLAVIX) 75 MG tablet Take 75 mg by mouth daily.   colchicine 0.6 MG tablet Take 0.6 mg by mouth 2 (two) times daily as needed.   ezetimibe (ZETIA) 10 MG tablet Take 10 mg by mouth daily.   fluticasone (FLONASE) 50 MCG/ACT nasal spray Place 2 sprays into both nostrils daily as needed for allergies or rhinitis. Reported on 10/05/2015   Menaquinone-7 (K2 PO) Take 1 tablet by mouth daily.   rosuvastatin (CRESTOR) 40 MG tablet Take 40 mg by mouth daily.   valsartan (DIOVAN) 160 MG tablet Take 160 mg by mouth daily.   aspirin EC 81 MG tablet Take 81 mg by mouth daily. (Patient not taking: Reported on 09/22/2023)   bisoprolol (ZEBETA) 10 MG tablet  (Patient not taking: Reported on 09/22/2023)   ezetimibe (ZETIA) 10 MG tablet Take 1 tablet by mouth daily.   Multiple Vitamin (MULTIVITAMIN) tablet Take 1 tablet by mouth daily. (Patient not taking: Reported on 10/22/2023)   tamsulosin (FLOMAX) 0.4 MG CAPS capsule Take 0.4 mg by mouth daily. (Patient not taking: Reported on 09/22/2023)   No facility-administered encounter medications on file as of 10/22/2023.    PAST MEDICAL HISTORY: Past Medical History:  Diagnosis Date   AAA (abdominal aortic aneurysm) (HCC)    Allergy    Carotid artery occlusion    Chronic kidney disease    Degenerative joint disease    Diverticulosis    Hx of adenomatous colonic polyps 10/29/2015   Hyperlipidemia    Hypertension    Stroke (HCC) 03/09/2023   Ischemic stroke affecting the circle of willis    PAST SURGICAL HISTORY: Past Surgical History:  Procedure Laterality Date   carotid surgery Left 03/13/2023   Angio seal vascular device placement Left Carotid artery   CATARACT EXTRACTION Bilateral 04/15/1999   FINGER SURGERY Bilateral 04/15/1963   after traumatic injury   TONSILLECTOMY  04/15/1955    ALLERGIES: No Known Allergies  FAMILY HISTORY: Family History  Family history unknown: Yes    SOCIAL HISTORY: Social History   Tobacco Use    Smoking status: Former    Current packs/day: 0.00    Types: Cigarettes    Quit date: 11/07/1966    Years since quitting: 56.9   Smokeless tobacco: Never  Vaping Use   Vaping status: Never Used  Substance Use Topics   Alcohol use: No    Alcohol/week: 0.0 standard drinks of alcohol   Drug use: No   Social History   Social History Narrative   Are you right handed or left handed? Right   Are you currently employed ?    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   Who lives with you? Adam Shepherd   What type of home do you live in: 1 story or 2 story? one    Caffiene 3 cups a day to 2 a day      Objective:  Vital Signs:  BP 121/72   Pulse 78   Ht 5' 7 (1.702 m)   Wt 196 lb (88.9 kg)   SpO2 95%   BMI 30.70 kg/m   General: No acute distress.  Patient appears well-groomed.   Head:  Normocephalic/atraumatic Neck: supple Back: No paraspinal tenderness Heart: regular rate and rhythm Lungs: Clear to auscultation bilaterally. Vascular: No carotid bruits.  Neurological Exam: Mental status: alert and oriented, no dysarthria. Mild word finding difficulty. Difficulty with repeating.  Cranial nerves: CN I: not tested CN II: pupils equal, round and reactive to light, visual fields intact CN III, IV, VI:  full range of motion, no nystagmus, no ptosis CN V: facial sensation intact. CN VII: upper and lower face symmetric CN VIII: hearing intact CN IX, X: uvula midline CN XI: sternocleidomastoid and trapezius muscles intact CN XII: tongue midline  Bulk & Tone: normal, no fasciculations. Motor:  muscle strength 5/5 throughout Deep Tendon Reflexes:  2+ throughout.   Sensation:  Light sensation intact. Finger to nose testing:  Without dysmetria.     Gait:  Normal station and stride.   Labs and Imaging review: New results: 05/21/23 external labs: HbA1c: 6.9 CMP significant for glucose 169 Lipid panel: tChol 90, LDL 37, TG 106  CTA head and neck  (06/12/23): IMPRESSION: *No acute intracranial abnormality. *Chronic appearing posterior left MCA territory infarct. *No hemodynamically significant stenosis of the carotid arteries. *Moderate narrowing at origins of vertebral arteries bilaterally. *No hemodynamically significant intracranial stenosis.  Previously reviewed results: External labs: Stroke Laboratory Studies Lab Results  Component Value Date  HGBA1C 6.1 (H) 03/09/2023  VITAMINB12 655 03/09/2023  FOLATE 11.9 03/09/2023  TSH 1.86 03/09/2023   Lab Results  Component Value Date  CHOL 193 03/13/2023  CHOL 210 (H) 03/09/2023   Lab Results  Component Value Date  HDL 32 (L) 03/13/2023  HDL 29 (L) 03/09/2023   Lab Results  Component Value Date  LDLCHOCALBLD 115 (H) 03/13/2023  LDLCHOCALBLD 03/09/2023  Comment:  CALCULATED LDL NOT AVAILABLE DUE TO TRIGLYCERIDE VALUE >400. ORDER LDL CHOLESTEROL, DIRECT (LAB102) IF DESIRED. Desirable: <100 mg/dL Above Desirable: 899-870 mg/dL Borderline High: 869-840 mg/dL High: 839-810 mg/dL Very High: >=809 mg/dL   Lab Results  Component Value Date  TRIG 232 (H) 03/13/2023  TRIG 414 (H) 03/09/2023    Carotid ultrasound (internal - 04/02/23): Summary:  Right Carotid: Velocities in the right ICA are consistent with a 1-39%  stenosis.   Left Carotid: Patent stent without evidence of stenosis.   Vertebrals:  Bilateral vertebral arteries demonstrate antegrade flow.  Subclavians: Normal flow hemodynamics were seen in bilateral subclavian arteries.    Imaging (external): FINDINGS:  PRECONTRAST HEAD CT:  There is no evidence of acute intracranial hemorrhage. There is no mass, significant mass effect, or abnormal extra-axial collection. The basal cisterns are patent.  The ventricles are normal in size.  The density of the dural venous sinuses is normal.  The skull base and calvarium are normal.  Scattered opacification of the paranasal sinuses.  CT PERFUSION:   Area  of increased time to peak and MTT in the left parieto-occipital lobe with decreased blood flow and preserved blood volume. Rapid AI calculates a perfusion defect of 25 mL.   NECK CTA:   The imaged aortic arch is normal. The origins of the brachiocephalic, bilateral common carotid, bilateral subclavian arteries demonstrate no significant stenosis. Atherosclerosis of the origins of the bilateral vertebral arteries.  Normal bilateral common carotid arteries.  Atherosclerotic disease of the  right carotid bifurcation and the proximal right internal carotid artery without significant stenosis by NASCET-like criteria.   Atherosclerotic disease of the left carotid bifurcation and the proximal left internal carotid artery without significant stenosis by NASCET-like criteria.   Scattered atherosclerotic disease of the bilateral cervical vertebral arteries without significant stenosis. Right dominant vertebral artery.  Shotty appearing neck lymph nodes.  Calcified left upper lobe granulomas. Nonspecific diffuse groundglass densities with areas of bronchiectasis.  Multilevel degenerative changes of the cervical spine without evidence of aggressive osseous lesion.  HEAD CTA:  Atherosclerotic disease of the cavernous, and supraclinoid segments of the bilateral internal carotid arteries (ICAs) without significant stenosis. The petrous segment is also without significant stenosis.   Occlusion of the distal left M3 branch (series 11 image 140). Normal vertebral, basilar, and posterior cerebral arteries.  No saccular aneurysm.   Impression  IMPRESSION:   CT Brain: No acute intracranial process. Specifically, no evidence of acute hemorrhage.  CT Perfusion: Area of small mismatch perfusion in the left parieto-occipital lobe. Rapid AI calculates a mismatch volume of 25 mL.   CTA Head: Focal occlusion of the distal left M3 artery which correlates with area of mismatch perfusion defect in  the left parieto-occipital lobe.  CTA Neck: No significant arterial stenosis.  CRITICAL RESULTS: New or unsuspected acute brain infarct  Venetia Sprague MD became aware of the critical finding at 03/09/2023 6:05 PM. Key results discussed with Dr. Gwenn at 03/09/2023 6:17 PM.   ADDITIONAL ATTENDING COMMENTS: Agree. With regards to the proximal left cervical ICA atherosclerotic disease, there is mixed calcified and noncalcified atheroma which results in a approximately 56% stenosis per NASCET-like criteria. It should be noted that the prominent hypodense atheroma has an irregular luminal border (for example axial CTA series 11 image 338) indicating instability and likely the embolic source of the current left M3 occlusion.  Dictated by: Venetia Sprague, MD. 03/09/2023 6:21 PM  I, Camellia Buddle, DO, have reviewed the study and agree with the findings in this report. 03/09/2023 7:07 PM  CT Head Wo Contrast  Narrative  EXAMINATION: CT HEAD WO CONTRAST 03/10/2023 6:00 PM  ACCESSION NUMBER: [id number]  INDICATION: Post TNK. 24 HR post TNK, 24 HR post TNK.  COMPARISON: CT brain attack 03/09/2023  TECHNIQUE: Thinly collimated spiral CT images of the brain without contrast with axial, coronal and sagittal reformations.   RADIMETRICS DOSE: Scanner: [id number] CTDIvol: 38.2 DLP: 743 mGy-cm.  FINDINGS:  Developing hypoattenuating area in the left parietal occipital lobe corresponding with mismatch perfusion on recent brain attack. The basal cisterns are patent.  The ventricles are normal in size.  The density of the dural venous sinuses is normal.  The skull base and calvarium are normal.  Scattered mucosal thickening of the paranasal sinuses. The visualized orbits are unremarkable.  Impression  IMPRESSION:   Hypoattenuating area consistent with acute infarct in the left parietal occipital lobe corresponding with perfusion abnormality on brain attack imaging. No evidence of intracranial  hemorrhage.  Dictated by: Arvella Arts, MD. 03/10/2023 6:06 PM  I, Hadassah Loel Dimitri, MD, have reviewed the study and agree with the findings in this report. 03/11/2023 3:00 PM  NES Embolization Cranial/Spinal  Addendum: 03/15/2023  Please note, left vert does not come off the arch. This was reported in error.    Narrative  INDICATION: 28 oy M presented 11/25 with acute onset of difficulty getting  words out. CT Batt with L M3 occlusion and L ICA stenosis. Nsgy consulted  for possible L ICA  stent. Patient with aphasia. While stenosis was only  55%, felt to be irregular plaque and potential source of embolic stroke.   COMPARISON: CTA  PROCEDURE: Stenting of left internal carotid artery with embolic  protection.   ATTENDING: Dr. Kicielinski, the attending and teaching physician, was  present and participated in the procedure.  ASSISTANTS: Delwin Romp  ANESTHESIA: Conscious Sedation (by General Anesthesia)   TRANSFEMORAL TECHNIQUE: After the risks, benefits, treatment options and  complications were discussed with the patient and family and they agreed  to proceed, the patient was brought to the angiography suite. Time out was  performed. The right groin region was prepped and draped in the usual  sterile fashion.Ultrasound guidance was used to select a site for access  in the right common femoral artery. The skin at the entry site was  anesthetized using 10cc of lidocaine. The micropuncture kit was used and a  5 french sheath was placed over a 0.035 PTFE floppy wire in the right  common femoral artery using Seldinger technique with imaging of the common  iliac artery confirming sheath placement. Utrasound guidance and  micropuncture technique were utilized for access.   Patient received a bolus of 5000 units of heparin. Baseline and serial  ACTs were obtained and the patient was redosed as necessary to maintain a  level between 200 and 250.   A Neuron Max, and a  Bernie insert in conjunction with a 0.038 angled  guidewire were used to selectively catheterize the right common carotid  artery, left vertebral artery, left common carotid artery (sim insert for  LCCA). After each vessel was selected multiple AP, lateral, oblique and  magnified angiographic runs were performed with filming over the head and  neck.   EMBOLIZATION: A Neuron Max, 071 catheter with a Sim insert in conjunction with a 0.038  angled guidewire were used to selectively catheterize the left common  carotid artery at which point the guide was advanced just proximal to the  bifurcation. The insert and guidewire were removed and the guide connected  to continuous heparinized flush as well as the power injector. Hand  injected angiography was performed for a control head run. We next  navigated a Synchro Select through a Spider FX rapid exchange system to  the petrous segment of the internal carotid where we deployed the 4mm  SpiderFX. Next we navigated a 5x71mm Precise stent across the stenotic  segment of the internal carotid artery. The stent was deployed under live  fluoroscopic visualization without complication. Hand injected angiography  performed which demonstrated appropriate stent placement without  complication. The SpiderFx was then recaptured. Angiography was again  performed which demonstrated appropriate deployment of the stent, good  wall apposition with no evidence of in stent stenosis or thrombosis. Head  run was performed showing distal vessels filled and cleared with same rate  as prior to embolization of the aneurysm without evidence of complication.  All catheters were removed. Hemostasis was achieved with Angioseal.    IMPLANTS:  PRECISE PRO RX 5 mm x 40 mm.    FINDINGS: RIGHT FEMORAL ARTERY: There is no evidence of iatrogenic injury. Access  site is above the bifurcation and appropriate for closure device.   RIGHT COMMON CAROTID ARTERY: The carotid  artery bifurcation is at the C3/4  level. The carotid bifurcation is widely patent without atherosclerotic  disease or hemodynamic significant stenosis. The cervical internal and  external carotid arteries are of normal course and caliber.  RIGHT COMMON CAROTID ARTERY:There are  normal arterial, capillary and  venous phases. The posterior communicating artery is small, fills the  posterior cerebral artery and rapidly clears from competitive flow. The  contralateral anterior cerebral artery flash fills via the anterior  communicating artery. No evidence of vasculitis, aneurysm, branch block  or AV shunting is seen. There are no areas with absent capillary blush.  Major venous sinuses are patent.  LEFT VERTEBRAL ARTERY: There are normal arterial, capillary and venous  phases. Bilateral posterior communicating arteries fill and rapidly clear  from competitive flow. The right distal vertebral artery does not  backfill. No evidence of vasculitis, aneurysm, branch block or AV shunting  is seen. There are no areas with absent capillary blush. Major venous  sinuses are patent. Left vertebral artery arises directly from the aortic  arch  LEFT COMMON CAROTID ARTERY: The carotid artery bifurcation is at the C3/4  level. The carotid bifurcation is patent but the origin of the cervical  internal carotid artery is very stenotic with significant plaque. The  cervical internal and external carotid arteries are of normal course  otherwise.  LEFT COMMON CAROTID ARTERY (PRE-STENTING): Internal carotid artery was  visualized via hand injection at the level of the common. There is severe  stenosis at the origin secondary to a thrombus. There are normal arterial,  capillary and venous phases. The posterior communicating artery is small,  fills the posterior cerebral artery and rapidly clears from competitive  flow. The contralateral anterior cerebral artery flash fills via the  anterior communicating  artery. No evidence of vasculitis, aneurysm,  branch block or AV shunting is seen. There are no areas with absent  capillary blush. Major venous sinuses are patent.  LEFT COMMON CAROTID ARTERY: Interval advancement of guide catheter to the  common carotid artery bifurcation. Hand injected angiography demonstrates  no evidence of iatrogenic injury. Control run of the head unchanged from  prior imaging.   LEFT COMMON CAROTID ARTERY (POST STENTING): Interval placement of  anti-embolic device into the petrous segment of the internal carotid  artery and stenting of the carotid bifurcation. Angiography was again  performed which demonstrated appropriate deployment of the stent, good  wall apposition with no evidence of in stent stenosis or thrombosis  LEFT COMMON CAROTID ARTERY (CONTROL RUN): Interval removal of anti-embolic  device. Control head run through the guide without evidence of  complication as compared to prior run.    Impression  :  Successful stenting of the left internal carotid artery without  complication.   ASA plavix responder  L vert arises directly from arch  Assessment/Plan:  This is CURVIN HUNGER, a 78 y.o. male with eft parieto-occipital lobe stroke s/p TNK and LICA stent in 02/2023. 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 In terms of stroke recovery, patient has done really well, finishing PT/OT and speech therapy and has only very mild word finding difficulty, particularly repeating without other clear deficits.  Plan: -Continue speech therapy exercises. Discussed working with family, repeating their sentences. Could also repeat sentences on TV. -Continue Plavix 75 mg daily -Continue Crestor 40 mg daily and Zetia 10 mg daily -Stroke warning discussed  Return to clinic in 6 months  Total time spent reviewing records, interview, history/exam, documentation, and coordination of care on day of encounter:  30 min  Venetia Potters, MD

## 2023-10-22 ENCOUNTER — Encounter: Payer: Self-pay | Admitting: Neurology

## 2023-10-22 ENCOUNTER — Ambulatory Visit: Payer: PPO | Admitting: Neurology

## 2023-10-22 VITALS — BP 121/72 | HR 78 | Ht 67.0 in | Wt 196.0 lb

## 2023-10-22 DIAGNOSIS — R479 Unspecified speech disturbances: Secondary | ICD-10-CM

## 2023-10-22 DIAGNOSIS — E785 Hyperlipidemia, unspecified: Secondary | ICD-10-CM

## 2023-10-22 DIAGNOSIS — I69928 Other speech and language deficits following unspecified cerebrovascular disease: Secondary | ICD-10-CM

## 2023-10-22 DIAGNOSIS — I63232 Cerebral infarction due to unspecified occlusion or stenosis of left carotid arteries: Secondary | ICD-10-CM

## 2023-10-22 DIAGNOSIS — R4701 Aphasia: Secondary | ICD-10-CM

## 2023-10-22 DIAGNOSIS — I6522 Occlusion and stenosis of left carotid artery: Secondary | ICD-10-CM

## 2023-10-22 DIAGNOSIS — I10 Essential (primary) hypertension: Secondary | ICD-10-CM

## 2023-10-22 NOTE — Patient Instructions (Addendum)
-  Continue speech therapy exercises. Discussed working with family, repeating their sentences. Could also repeat sentences on TV.  -Continue Plavix 75 mg daily -Continue Crestor 40 mg daily and Zetia 10 mg daily  If you have new difficulty speaking, face droop, numbness on one side of the body, weakness on one side of the body, or dizziness/imbalance, this could be the sign of a stroke. Don't wait, please call EMS and be evaluated at the nearest emergency room.  The physicians and staff at [facility] are committed to providing excellent care. You may receive a survey requesting feedback about your experience at our office. We strive to receive very good responses to the survey questions. If you feel that your experience would prevent you from giving the office a very good  response, please contact our office to try to remedy the situation. We may be reached at [phone]. Thank you for taking the time out of your busy day to complete the survey.  Venetia Potters, MD [facility]

## 2023-12-28 ENCOUNTER — Emergency Department (HOSPITAL_COMMUNITY)

## 2023-12-28 ENCOUNTER — Encounter (HOSPITAL_COMMUNITY): Payer: Self-pay

## 2023-12-28 ENCOUNTER — Other Ambulatory Visit: Payer: Self-pay

## 2023-12-28 ENCOUNTER — Emergency Department (HOSPITAL_COMMUNITY)
Admission: EM | Admit: 2023-12-28 | Discharge: 2023-12-29 | Discharge status: Against Medical Advice | Attending: Emergency Medicine | Admitting: Emergency Medicine

## 2023-12-28 DIAGNOSIS — R079 Chest pain, unspecified: Secondary | ICD-10-CM | POA: Insufficient documentation

## 2023-12-28 DIAGNOSIS — I517 Cardiomegaly: Secondary | ICD-10-CM | POA: Diagnosis not present

## 2023-12-28 DIAGNOSIS — R0789 Other chest pain: Secondary | ICD-10-CM | POA: Diagnosis not present

## 2023-12-28 DIAGNOSIS — I700 Atherosclerosis of aorta: Secondary | ICD-10-CM | POA: Diagnosis not present

## 2023-12-28 DIAGNOSIS — Z5321 Procedure and treatment not carried out due to patient leaving prior to being seen by health care provider: Secondary | ICD-10-CM | POA: Insufficient documentation

## 2023-12-28 DIAGNOSIS — R030 Elevated blood-pressure reading, without diagnosis of hypertension: Secondary | ICD-10-CM | POA: Diagnosis not present

## 2023-12-28 DIAGNOSIS — R42 Dizziness and giddiness: Secondary | ICD-10-CM | POA: Diagnosis not present

## 2023-12-28 LAB — CBC
HCT: 43.6 % (ref 39.0–52.0)
Hemoglobin: 14.6 g/dL (ref 13.0–17.0)
MCH: 30.7 pg (ref 26.0–34.0)
MCHC: 33.5 g/dL (ref 30.0–36.0)
MCV: 91.6 fL (ref 80.0–100.0)
Platelets: 224 K/uL (ref 150–400)
RBC: 4.76 MIL/uL (ref 4.22–5.81)
RDW: 14.6 % (ref 11.5–15.5)
WBC: 11.9 K/uL — ABNORMAL HIGH (ref 4.0–10.5)
nRBC: 0 % (ref 0.0–0.2)

## 2023-12-28 LAB — BASIC METABOLIC PANEL WITH GFR
Anion gap: 11 (ref 5–15)
BUN: 24 mg/dL — ABNORMAL HIGH (ref 8–23)
CO2: 22 mmol/L (ref 22–32)
Calcium: 9.8 mg/dL (ref 8.9–10.3)
Chloride: 103 mmol/L (ref 98–111)
Creatinine, Ser: 1.24 mg/dL (ref 0.61–1.24)
GFR, Estimated: 60 mL/min — ABNORMAL LOW (ref >60)
Glucose, Bld: 194 mg/dL — ABNORMAL HIGH (ref 70–99)
Potassium: 4.2 mmol/L (ref 3.5–5.1)
Sodium: 136 mmol/L (ref 135–145)

## 2023-12-28 LAB — TROPONIN I (HIGH SENSITIVITY)
Troponin I (High Sensitivity): 8 ng/L (ref <18)
Troponin I (High Sensitivity): 6 ng/L (ref <18)

## 2023-12-28 NOTE — ED Triage Notes (Addendum)
 QTN: Patient reports left sided chest pain with dizziness. Reports chest pain started at 6:30pm. Cardiac history.

## 2023-12-28 NOTE — ED Provider Triage Note (Signed)
 Emergency Medicine Provider Triage Evaluation Note  Adam Shepherd , a 78 y.o. male  was evaluated in triage.  Pt complains of chest pain.  Reports that he began to experience left-sided dull chest pain around 6:30 PM this evening with some level of reproducibility on palpation.  He did note that his blood pressure was considerably elevated and was also tachycardic at that time and did feel diaphoretic.  No reported shortness of breath, nausea, or vomiting.  Review of Systems  Positive: As above Negative: As above  Physical Exam  BP 135/82 (BP Location: Left Arm)   Pulse 81   Temp 98 F (36.7 C)   Resp 20   Ht 5' 7 (1.702 m)   Wt 86.2 kg   SpO2 95%   BMI 29.76 kg/m  Gen:   Awake, no distress Resp:  Normal effort  MSK:   Moves extremities without difficulty Other:   Medical Decision Making  Medically screening exam initiated at 9:36 PM.  Appropriate orders placed.  Adam Shepherd was informed that the remainder of the evaluation will be completed by another provider, this initial triage assessment does not replace that evaluation, and the importance of remaining in the ED until their evaluation is complete.     Carletta Feasel A, PA-C 12/28/23 [id number]

## 2023-12-28 NOTE — ED Triage Notes (Signed)
 Pt presents with a dull L sided CP that started at 18:30. Pain is reproducible to palpation. He checked his V/S and noted a BP of 203/95 and HR of 115. He did feel clammy at the time. He denies any ShOB or nausea.

## 2023-12-29 ENCOUNTER — Telehealth: Payer: Self-pay

## 2023-12-29 NOTE — Telephone Encounter (Signed)
 [facility]oe called asking for an earlier appointment for [name].  However, the appointment needed was with his cardiologist, Dr. Leigh.  She was advised to call Dr. Leigh to schedule. Mrs. Aguayo verbalized understanding.

## 2023-12-29 NOTE — ED Notes (Signed)
 Pt and family leaving lobby d/t wait time. Pt made sort staff aware before departure.

## 2023-12-30 DIAGNOSIS — R0789 Other chest pain: Secondary | ICD-10-CM | POA: Diagnosis not present

## 2023-12-30 DIAGNOSIS — E119 Type 2 diabetes mellitus without complications: Secondary | ICD-10-CM | POA: Diagnosis not present

## 2023-12-30 DIAGNOSIS — I10 Essential (primary) hypertension: Secondary | ICD-10-CM | POA: Diagnosis not present

## 2023-12-30 DIAGNOSIS — I63232 Cerebral infarction due to unspecified occlusion or stenosis of left carotid arteries: Secondary | ICD-10-CM | POA: Diagnosis not present

## 2023-12-30 DIAGNOSIS — I69320 Aphasia following cerebral infarction: Secondary | ICD-10-CM | POA: Diagnosis not present

## 2023-12-30 DIAGNOSIS — Z79899 Other long term (current) drug therapy: Secondary | ICD-10-CM | POA: Diagnosis not present

## 2024-01-14 DIAGNOSIS — Z8673 Personal history of transient ischemic attack (TIA), and cerebral infarction without residual deficits: Secondary | ICD-10-CM | POA: Diagnosis not present

## 2024-01-14 DIAGNOSIS — E785 Hyperlipidemia, unspecified: Secondary | ICD-10-CM | POA: Diagnosis not present

## 2024-01-14 DIAGNOSIS — I10 Essential (primary) hypertension: Secondary | ICD-10-CM | POA: Diagnosis not present

## 2024-01-14 DIAGNOSIS — E118 Type 2 diabetes mellitus with unspecified complications: Secondary | ICD-10-CM | POA: Diagnosis not present

## 2024-02-10 ENCOUNTER — Other Ambulatory Visit: Payer: Self-pay | Admitting: Vascular Surgery

## 2024-02-10 DIAGNOSIS — I714 Abdominal aortic aneurysm, without rupture, unspecified: Secondary | ICD-10-CM

## 2024-02-10 DIAGNOSIS — I6523 Occlusion and stenosis of bilateral carotid arteries: Secondary | ICD-10-CM

## 2024-02-10 DIAGNOSIS — I7143 Infrarenal abdominal aortic aneurysm, without rupture: Secondary | ICD-10-CM

## 2024-03-01 NOTE — Progress Notes (Signed)
 CARDIOLOGY CONSULT NOTE       Patient ID: Adam Shepherd MRN: [id number] DOB/AGE: [birth year] 78 y.o.  Admit date: (Not on file) Referring Physician: Verena Primary Physician: Verena Mems, MD Primary Cardiologist: New Reason for Consultation: Tachycardia/Chest pain   HPI:  78 y.o. 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 Recurrent CVA 04/22/23 driving to [place]. CTA with focal occlusion of distal left M3 received TNK subsequently had left ICA stent. Had residual word finding difficulty. Transitioned from DAPT to ASA and on crestor and Zetia  Seen in ED 12/28/23 for L sided CP reproducible to palpation.  BP was elevated with tachycardia. No dyspnea. CXR borderline CE. Troponin negative x 2  ECG showed NSR rate 79 normal 12/31/23   Sees Dr Gretta for VVS Duplex 09/22/23 with patent left ICA stent no right sided dx. 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   Does not appear to have had prior stress testing or CAD despite risk factors and vascular dx with PVD and carotid stenting.  Takes Zebeta and diovan for BP. On Crestor and zetia for HLD.   Of note he has had tightness and cramping  in calf right leg   He is a education officer, museum and works a lot.   ROS All other systems reviewed and negative except as noted above  Past Medical History:  Diagnosis Date   AAA (abdominal aortic aneurysm)    Allergy    Carotid artery occlusion    Chronic kidney disease    Degenerative joint disease    Diverticulosis    Hx of adenomatous colonic polyps 10/29/2015   Hyperlipidemia    Hypertension    Stroke (HCC) 03/09/2023   Ischemic stroke affecting the circle of willis    Family History  Family history unknown: Yes    Social History   Socioeconomic History   Marital status: Married    Spouse name: Not on file   Number of children: Not on file   Years of education: Not on file    Highest education level: Not on file  Occupational History   Not on file  Tobacco Use   Smoking status: Former    Current packs/day: 0.00    Types: Cigarettes    Quit date: 11/07/1966    Years since quitting: 57.3   Smokeless tobacco: Never  Vaping Use   Vaping status: Never Used  Substance and Sexual Activity   Alcohol use: No    Alcohol/week: 0.0 standard drinks of alcohol   Drug use: No   Sexual activity: Not on file  Other Topics Concern   Not on file  Social History Narrative   Are you right handed or left handed? Right   Are you currently employed ?    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   Who lives with you? wife   What type of home do you live in: 1 story or 2 story? one    Caffiene 3 cups a day to 2 a day   Social Drivers of Corporate Investment Banker Strain: Not on file  Food Insecurity: No Food Insecurity (03/16/2023)   Received from [facility]    Hunger Vital Sign    Within the past 12 months, you worried that your food would run out before you got the money to buy more.:  Never true    Within the past 12 months, the food you bought just didn't last and you didn't have money to get more.: Never true  Transportation Needs: No Transportation Needs (03/16/2023)   Received from [facility]    [employer] - Transportation    Lack of Transportation (Medical): No    Lack of Transportation (Non-Medical): No  Physical Activity: Not on file  Stress: Not on file  Social Connections: Not on file  Intimate Partner Violence: Not At Risk (03/09/2023)   Received from [facility]    Abuse Screen    Feels Unsafe at Home or Work/School: no    Feels Threatened by Someone: no    Does Anyone Try to Keep You From Having Contact with Others or Doing Things Outside Your Home?: no    Physical Signs of Abuse Present: no    Past Surgical History:  Procedure Laterality Date    carotid surgery Left 03/13/2023   Angio seal vascular device placement Left Carotid artery   CATARACT EXTRACTION Bilateral 04/15/1999   FINGER SURGERY Bilateral 04/15/1963   after traumatic injury   TONSILLECTOMY  04/15/1955      Current Outpatient Medications:    bisoprolol (ZEBETA) 10 MG tablet, Take 10 mg by mouth daily., Disp: , Rfl:    cholecalciferol (VITAMIN D3) 25 MCG (1000 UNIT) tablet, Take 1,000 Units by mouth daily., Disp: , Rfl:    clopidogrel (PLAVIX) 75 MG tablet, Take 75 mg by mouth daily., Disp: , Rfl:    colchicine 0.6 MG tablet, Take 0.6 mg by mouth 2 (two) times daily as needed., Disp: , Rfl:    ezetimibe (ZETIA) 10 MG tablet, Take 10 mg by mouth daily., Disp: , Rfl:    rosuvastatin (CRESTOR) 40 MG tablet, Take 40 mg by mouth daily., Disp: , Rfl:    valsartan (DIOVAN) 160 MG tablet, Take 160 mg by mouth daily., Disp: , Rfl:    fluticasone (FLONASE) 50 MCG/ACT nasal spray, Place 2 sprays into both nostrils daily as needed for allergies or rhinitis. Reported on 10/05/2015 (Patient not taking: Reported on 03/07/2024), Disp: , Rfl:    Menaquinone-7 (K2 PO), Take 1 tablet by mouth daily. (Patient not taking: Reported on 03/07/2024), Disp: , Rfl:    Multiple Vitamin (MULTIVITAMIN) tablet, Take 1 tablet by mouth daily. (Patient not taking: Reported on 03/07/2024), Disp: , Rfl:     Physical Exam: Blood pressure 92/60, pulse 63, height 5' 7 (1.702 m), weight 193 lb (87.5 kg), SpO2 96%.    Affect appropriate Healthy:  appears stated age HEENT: normal Neck supple with no adenopathy JVP normal no bruits no thyromegaly Lungs clear with no wheezing and good diaphragmatic motion Heart:  S1/S2 no murmur, no rub, gallop or click PMI normal Abdomen: benighn, BS positve, no tenderness, no AAA no bruit.  No HSM or HJR Distal pulses intact with no bruits Plus one right LE edema Neuro non focal Skin warm and dry No muscular weakness   Labs:   Lab Results  Component Value  Date   WBC 11.9 (H) 12/28/2023   HGB 14.6 12/28/2023   HCT 43.6 12/28/2023   MCV 91.6 12/28/2023   PLT 224 12/28/2023   No results for input(s): NA, K, CL, CO2, BUN, CREATININE, CALCIUM, PROT, BILITOT, ALKPHOS, ALT, AST, GLUCOSE in the last 168 hours.  Invalid input(s): LABALBU No results found for: CKTOTAL, CKMB, CKMBINDEX, TROPONINI No results found for: CHOL No results found for: HDL No results found  for: LDLCALC No results found for: TRIG No results found for: CHOLHDL No results found for: LDLDIRECT    Radiology: No results found.  EKG: SR rate 75 normal 12/31/23    ASSESSMENT AND PLAN:   Chest Pain: evaluated in ER 12/2023. R/O no ECG changes pain reproducible to palpation. Given vascular dx and risk factors favor PET/CT to further evaluate HTN:  on zebeta and diovan   HLD:  continue crestor and zetia  LDL 37 on labs 05/21/23  Calf Pain:  in setting of chest pain. Normal peripheral pulses Needs LE venous duplex on right to r/o DVT   PET/CT Right LE venous duplex   F/U VVS Dr Gretta for AAA/left ICA stent F/U cardiology in a year pending testing    Signed: Maude Emmer 03/07/2024, 3:50 PM

## 2024-03-02 DIAGNOSIS — Z0000 Encounter for general adult medical examination without abnormal findings: Secondary | ICD-10-CM | POA: Diagnosis not present

## 2024-03-07 ENCOUNTER — Encounter: Payer: Self-pay | Admitting: Cardiovascular Disease

## 2024-03-07 ENCOUNTER — Ambulatory Visit: Attending: Cardiovascular Disease | Admitting: Cardiovascular Disease

## 2024-03-07 VITALS — BP 92/60 | HR 63 | Ht 67.0 in | Wt 193.0 lb

## 2024-03-07 DIAGNOSIS — I714 Abdominal aortic aneurysm, without rupture, unspecified: Secondary | ICD-10-CM

## 2024-03-07 DIAGNOSIS — I6523 Occlusion and stenosis of bilateral carotid arteries: Secondary | ICD-10-CM

## 2024-03-07 DIAGNOSIS — I824Y1 Acute embolism and thrombosis of unspecified deep veins of right proximal lower extremity: Secondary | ICD-10-CM

## 2024-03-07 DIAGNOSIS — R079 Chest pain, unspecified: Secondary | ICD-10-CM

## 2024-03-07 NOTE — Patient Instructions (Signed)
 Medication Instructions:  Your physician recommends that you continue on your current medications as directed. Please refer to the Current Medication list given to you today.  *If you need a refill on your cardiac medications before your next appointment, please call your pharmacy*  Lab Work: None  If you have labs (blood work) drawn today and your tests are completely normal, you will receive your results only by: MyChart Message (if you have MyChart) OR A paper copy in the mail If you have any lab test that is abnormal or we need to change your treatment, we will call you to review the results.  Testing/Procedures: Venous Doppler Your physician has requested that you have a lower or upper extremity venous duplex. This test is an ultrasound of the veins in the legs or arms. It looks at venous blood flow that carries blood from the heart to the legs or arms. Allow one hour for a Lower Venous exam. Allow thirty minutes for an Upper Venous exam. There are no restrictions or special instructions.  Please note: We ask at that you not bring children with you during ultrasound (echo/ vascular) testing. Due to room size and safety concerns, children are not allowed in the ultrasound rooms during exams. Our front office staff cannot provide observation of children in our lobby area while testing is being conducted. An adult accompanying a patient to their appointment will only be allowed in the ultrasound room at the discretion of the ultrasound technician under special circumstances. We apologize for any inconvenience.   Cardiac PET CT    Please report to Radiology at the [facility] Main Entrance 30 minutes early for your test.  [address] [place], [place] [address]                         OR   Please report to Radiology at [facility] Main Entrance, medical mall, 30 mins prior to your test.  [address]  [place], [place]  How to Prepare for Your  Cardiac PET/CT Stress Test:  Nothing to eat or drink, except water, 3 hours prior to arrival time.  NO caffeine/decaffeinated products, or chocolate 12 hours prior to arrival. (Please note decaffeinated beverages (teas/coffees) still contain caffeine).  If you have caffeine within 12 hours prior, the test will need to be rescheduled.  Medication instructions: Do not take erectile dysfunction medications for 72 hours prior to test (sildenafil, tadalafil) Do not take nitrates (isosorbide mononitrate, Ranexa) the day before or day of test Do not take tamsulosin the day before or morning of test Hold theophylline containing medications for 12 hours. Hold Dipyridamole 48 hours prior to the test.  Diabetic Preparation: If able to eat breakfast prior to 3 hour fasting, you may take all medications, including your insulin. Do not worry if you miss your breakfast dose of insulin - start at your next meal. If you do not eat prior to 3 hour fast-Hold all diabetes (oral and insulin) medications. Patients who wear a continuous glucose monitor MUST remove the device prior to scanning.  You may take your remaining medications with water.  NO perfume, cologne or lotion on chest or abdomen area. FEMALES - Please avoid wearing dresses to this appointment.  Total time is 1 to 2 hours; you may want to bring reading material for the waiting time.  IF YOU THINK YOU MAY BE PREGNANT, OR ARE NURSING PLEASE INFORM THE TECHNOLOGIST.  In  preparation for your appointment, medication and supplies will be purchased.  Appointment availability is limited, so if you need to cancel or reschedule, please call the Radiology Department Scheduler at [phone] 24 hours in advance to avoid a cancellation fee of $100.00  What to Expect When you Arrive:  Once you arrive and check in for your appointment, you will be taken to a preparation room within the Radiology Department.  A technologist or Nurse will obtain your medical  history, verify that you are correctly prepped for the exam, and explain the procedure.  Afterwards, an IV will be started in your arm and electrodes will be placed on your skin for EKG monitoring during the stress portion of the exam. Then you will be escorted to the PET/CT scanner.  There, staff will get you positioned on the scanner and obtain a blood pressure and EKG.  During the exam, you will continue to be connected to the EKG and blood pressure machines.  A small, safe amount of a radioactive tracer will be injected in your IV to obtain a series of pictures of your heart along with an injection of a stress agent.    After your Exam:  It is recommended that you eat a meal and drink a caffeinated beverage to counter act any effects of the stress agent.  Drink plenty of fluids for the remainder of the day and urinate frequently for the first couple of hours after the exam.  Your doctor will inform you of your test results within 7-10 business days.  For more information and frequently asked questions, please visit our website: [email or web address]  For questions about your test or how to prepare for your test, please call: Cardiac Imaging Nurse Navigators Office: [phone]   Follow-Up: At [facility], you and your health needs are our priority.  As part of our continuing mission to provide you with exceptional heart care, our providers are all part of one team.  This team includes your primary Cardiologist (physician) and Advanced Practice Providers or APPs (Physician Assistants and Nurse Practitioners) who all work together to provide you with the care you need, when you need it.  Your next appointment:   1 year(s)  Provider:   Dr. Maude Emmer       Other Instructions None

## 2024-03-08 ENCOUNTER — Ambulatory Visit: Payer: Self-pay | Admitting: Cardiovascular Disease

## 2024-03-08 ENCOUNTER — Ambulatory Visit (HOSPITAL_COMMUNITY)
Admission: RE | Admit: 2024-03-08 | Discharge: 2024-03-08 | Disposition: A | Discharge status: Home / Self Care | Source: Ambulatory Visit | Attending: Cardiovascular Disease | Admitting: Cardiovascular Disease

## 2024-03-08 DIAGNOSIS — I824Y1 Acute embolism and thrombosis of unspecified deep veins of right proximal lower extremity: Secondary | ICD-10-CM | POA: Insufficient documentation

## 2024-03-18 NOTE — Progress Notes (Signed)
 Adam Shepherd                                          MRN: [id number]   03/18/2024   The VBCI Quality Team Specialist reviewed this patient medical record for the purposes of chart review for care gap closure. The following were reviewed: chart review for care gap closure-kidney health evaluation for diabetes:eGFR  and uACR.    VBCI Quality Team

## 2024-03-20 NOTE — Progress Notes (Unsigned)
 Patient name: Adam Shepherd MRN: [id number] DOB: 1945/12/28 Sex: male  REASON FOR CONSULT: 6 month f/u for AAA and carotid artery surveillance  HPI: Adam Shepherd is a 78 y.o. male, with history of hypertension and hyperlipidemia who presents for 6 month follow-up for known abdominal aortic aneurysm and also carotid artery surveillance.  He was last seen in our practice on 09/22/2023 with a 4.6 cm AAA and previously was 4.9 cm.  Unfortunately was hospitalized at [facility] over Thanksgiving 2024 with a stroke.  States he had a stroke with some slurred speech and right-sided weakness.  Ultimately underwent a transfemoral left carotid stent at [facility].    His PCP took him off aspirin due to gastritis but he remained on Plavix  Past Medical History:  Diagnosis Date   AAA (abdominal aortic aneurysm)    Allergy    Carotid artery occlusion    Chronic kidney disease    Degenerative joint disease    Diverticulosis    Hx of adenomatous colonic polyps 10/29/2015   Hyperlipidemia    Hypertension    Stroke (HCC) 03/09/2023   Ischemic stroke affecting the circle of willis    Past Surgical History:  Procedure Laterality Date   carotid surgery Left 03/13/2023   Angio seal vascular device placement Left Carotid artery   CATARACT EXTRACTION Bilateral 04/15/1999   FINGER SURGERY Bilateral 04/15/1963   after traumatic injury   TONSILLECTOMY  04/15/1955    Family History  Family history unknown: Yes    SOCIAL HISTORY: Social History   Socioeconomic History   Marital status: Married    Spouse name: Not on file   Number of children: Not on file   Years of education: Not on file   Highest education level: Not on file  Occupational History   Not on file  Tobacco Use   Smoking status: Former    Current packs/day: 0.00    Types: Cigarettes    Quit date: 11/07/1966    Years since quitting: 57.4   Smokeless tobacco: Never  Vaping Use   Vaping status: Never Used  Substance and Sexual  Activity   Alcohol use: No    Alcohol/week: 0.0 standard drinks of alcohol   Drug use: No   Sexual activity: Not on file  Other Topics Concern   Not on file  Social History Narrative   Are you right handed or left handed? Right   Are you currently employed ?    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   Who lives with you? wife   What type of home do you live in: 1 story or 2 story? one    Caffiene 3 cups a day to 2 a day   Social Drivers of Corporate Investment Banker Strain: Not on file  Food Insecurity: No Food Insecurity (03/16/2023)   Received from [facility]    Hunger Vital Sign    Within the past 12 months, you worried that your food would run out before you got the money to buy more.: Never true    Within the past 12 months, the food you bought just didn't last and you didn't have money to get more.: Never true  Transportation Needs: No Transportation Needs (03/16/2023)   Received from [facility]    [employer] - Transportation    Lack of Transportation (Medical): No    Lack of Transportation (Non-Medical): No  Physical  Activity: Not on file  Stress: Not on file  Social Connections: Not on file  Intimate Partner Violence: Not At Risk (03/09/2023)   Received from [facility]    Abuse Screen    Feels Unsafe at Home or Work/School: no    Feels Threatened by Someone: no    Does Anyone Try to Keep You From Having Contact with Others or Doing Things Outside Your Home?: no    Physical Signs of Abuse Present: no    Allergies  Allergen Reactions   Metoprolol Tartrate     Other Reaction(s): bad dreams, cough    Current Outpatient Medications  Medication Sig Dispense Refill   bisoprolol (ZEBETA) 10 MG tablet Take 10 mg by mouth daily.     cholecalciferol (VITAMIN D3) 25 MCG (1000 UNIT) tablet Take 1,000 Units by mouth daily.     clopidogrel (PLAVIX) 75 MG tablet Take  75 mg by mouth daily.     colchicine 0.6 MG tablet Take 0.6 mg by mouth 2 (two) times daily as needed.     ezetimibe (ZETIA) 10 MG tablet Take 10 mg by mouth daily.     fluticasone (FLONASE) 50 MCG/ACT nasal spray Place 2 sprays into both nostrils daily as needed for allergies or rhinitis. Reported on 10/05/2015 (Patient not taking: Reported on 03/07/2024)     Menaquinone-7 (K2 PO) Take 1 tablet by mouth daily. (Patient not taking: Reported on 03/07/2024)     Multiple Vitamin (MULTIVITAMIN) tablet Take 1 tablet by mouth daily. (Patient not taking: Reported on 03/07/2024)     rosuvastatin (CRESTOR) 40 MG tablet Take 40 mg by mouth daily.     valsartan (DIOVAN) 160 MG tablet Take 160 mg by mouth daily.     No current facility-administered medications for this visit.    REVIEW OF SYSTEMS:  [X]  denotes positive finding, [ ]  denotes negative finding Cardiac  Comments:  Chest pain or chest pressure:    Shortness of breath upon exertion:    Short of breath when lying flat:    Irregular heart rhythm:        Vascular    Pain in calf, thigh, or hip brought on by ambulation:    Pain in feet at night that wakes you up from your sleep:     Blood clot in your veins:    Leg swelling:         Pulmonary    Oxygen at home:    Productive cough:     Wheezing:         Neurologic    Sudden weakness in arms or legs:     Sudden numbness in arms or legs:     Sudden onset of difficulty speaking or slurred speech:    Temporary loss of vision in one eye:     Problems with dizziness:         Gastrointestinal    Blood in stool:     Vomited blood:         Genitourinary    Burning when urinating:     Blood in urine:        Psychiatric    Major depression:         Hematologic    Bleeding problems:    Problems with blood clotting too easily:        Skin    Rashes or ulcers:        Constitutional    Fever or chills:  PHYSICAL EXAM: There were no vitals filed for this  visit.    GENERAL: The patient is a well-nourished male, in no acute distress. The vital signs are documented above. CARDIAC: There is a regular rate and rhythm.  VASCULAR:  Palpable femoral pulses bilaterally Palpable DP pulses bilaterally PULMONARY: No respiratory distress. ABDOMEN: Soft and non-tender.  No pain with palpation of aneurysm. MUSCULOSKELETAL: There are no major deformities or cyanosis. NEUROLOGIC: No focal weakness or paresthesias are detected.  Cranial nerves II through XII grossly intact. SKIN: There are no ulcers or rashes noted. PSYCHIATRIC: The patient has a normal affect.  DATA:   AAA duplex today shows AAA  (previously 4.6 cm)    Carotid duplex today shows minimal 1 to 39% right ICA stenosis and a patent left ICA stent with no stenosis  Assessment/Plan:  78 year old male presents for 6 month follow-up of known 4.6 cm abdominal aortic aneurysm and also carotid artery surveillance.    I discussed there has been no growth in his aneurysm over the last 6 months.  The maximal diameter on duplex today was 4.6 cm.  Discussed in men we repair AAA at greater than 5.5 cm.    Carotid duplex today shows his left carotid stent is widely patent.  He has minimal 1 to 39% stenosis on the right.  He is on Plavix statin for stent patency.  Will arrange follow-up in 1 year with repeat carotid duplex.   Lonni DOROTHA Gaskins, MD [facility] Office: [phone]

## 2024-03-22 ENCOUNTER — Ambulatory Visit (HOSPITAL_COMMUNITY)
Admission: RE | Admit: 2024-03-22 | Discharge: 2024-03-22 | Disposition: A | Source: Ambulatory Visit | Attending: Vascular Surgery

## 2024-03-22 ENCOUNTER — Encounter: Payer: Self-pay | Admitting: Vascular Surgery

## 2024-03-22 ENCOUNTER — Ambulatory Visit: Admitting: Vascular Surgery

## 2024-03-22 VITALS — BP 119/70 | HR 64 | Temp 98.5°F | Resp 18 | Ht 67.0 in | Wt 189.0 lb

## 2024-03-22 DIAGNOSIS — I7143 Infrarenal abdominal aortic aneurysm, without rupture: Secondary | ICD-10-CM

## 2024-03-22 DIAGNOSIS — I6523 Occlusion and stenosis of bilateral carotid arteries: Secondary | ICD-10-CM | POA: Diagnosis not present

## 2024-03-22 DIAGNOSIS — I714 Abdominal aortic aneurysm, without rupture, unspecified: Secondary | ICD-10-CM

## 2024-03-23 ENCOUNTER — Other Ambulatory Visit: Payer: Self-pay

## 2024-03-23 DIAGNOSIS — H34212 Partial retinal artery occlusion, left eye: Secondary | ICD-10-CM | POA: Diagnosis not present

## 2024-03-23 DIAGNOSIS — I7143 Infrarenal abdominal aortic aneurysm, without rupture: Secondary | ICD-10-CM

## 2024-03-30 ENCOUNTER — Other Ambulatory Visit (HOSPITAL_COMMUNITY)

## 2024-04-18 ENCOUNTER — Other Ambulatory Visit (HOSPITAL_COMMUNITY): Payer: Self-pay

## 2024-04-18 MED ORDER — VALSARTAN 160 MG PO TABS
160.0000 mg | ORAL_TABLET | Freq: Every day | ORAL | 3 refills | Status: AC
  Filled 2024-05-07: qty 90, 90d supply, fill #0

## 2024-04-18 MED ORDER — BISOPROLOL FUMARATE 10 MG PO TABS
10.0000 mg | ORAL_TABLET | Freq: Every day | ORAL | 2 refills | Status: AC
  Filled 2024-04-18 – 2024-05-07 (×2): qty 90, 90d supply, fill #0

## 2024-04-18 MED ORDER — VALSARTAN 160 MG PO TABS
160.0000 mg | ORAL_TABLET | Freq: Every day | ORAL | 0 refills | Status: AC
  Filled 2024-04-18: qty 90, 90d supply, fill #0

## 2024-04-19 ENCOUNTER — Other Ambulatory Visit (HOSPITAL_COMMUNITY): Payer: Self-pay

## 2024-04-19 ENCOUNTER — Other Ambulatory Visit: Payer: Self-pay

## 2024-04-19 MED ORDER — SEMAGLUTIDE-WEIGHT MANAGEMENT 0.25 MG/0.5ML ~~LOC~~ SOAJ
0.2500 mg | SUBCUTANEOUS | 0 refills | Status: AC
  Filled 2024-04-19 – 2024-04-22 (×2): qty 2, 28d supply, fill #0

## 2024-04-20 ENCOUNTER — Other Ambulatory Visit (HOSPITAL_COMMUNITY): Payer: Self-pay

## 2024-04-20 NOTE — Progress Notes (Signed)
 "  NEUROLOGY FOLLOW UP OFFICE NOTE  ROWDY GUERRINI [id number]  Subjective:  Adam Shepherd is a 79 y.o. 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  To briefly review: 04/22/23: Patient was driving in [place], [place] on 03/09/23. Wife noticed patient was driving poorly and not really using his right arm. He pulled over. Wife tested him for stroke and noticed when he tried to repeat a sentence, it was word salad. Wife is orthoptist. EMS took patient to hospital where [facility] was 2 (1 for facial asymmetry, 1 for language). He was into the ED within 40 minutes. CTH showed no acute process. CT perfusion showed a small mismatch in the left parieto-occipital lobe (mismatch volume of 25 mL). CTA head and neck showed focal occlusion of distal left M3. TNK was administered.   He then had vision changes and right sided weakness on day 2 during routine neurologic testing. He went for angio and got a left ICA stent. Patient was started on ASA 325 mg and Plavix 75 mg. The plan was to continue DAPT for at least 3 months followed by asa monotherapy. He is currently on aspirin 81 mg daily due to stomach issues.    He was also started on Crestor 40 mg and Zetia 10 mg daily. 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   He was discharged on 03/16/23. At discharge, he had a little word finding difficulty. He was discharged with PT/OT and speech therapy, which has been completed.    Currently, he has occasional mild word finding difficulty if he is trying to speak too fast. His memory has been much better since even before the stroke. Wife thinks he is doing very well.   Patient continues to work, as a hydrographic surveyor. He is currently having no difficulties at work.   10/22/23: I was called by Dr. Glendia at  [facility] on 05/29/23. Per that conversation, there were 6 plaques in 2 different vessels of the left retina. Dr. Glendia wanted to make sure we knew. Unfortunately, I explained this was expected given known left ICA stenosis s/p stent. CTA head and neck repeat looked good, so I wanted to speak to patient about medications. I left a message and sent a MyChart without return message or call.   He still has occasional mild word finding difficulty. He denies any other symptoms currently.   Patient is now only on Plavix 75 mg daily. He stopped aspirin about 6 months ago per his report. He continues to take Crestor and Zetia.  Most recent Assessment and Plan (10/22/23): This is Adam Shepherd, a 79 y.o. male with eft parieto-occipital lobe stroke s/p TNK and LICA stent in 02/2023. 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 In terms of stroke recovery, patient has done really well, finishing PT/OT and speech therapy and has only very mild word finding difficulty, particularly repeating without other clear deficits.   Plan: -Continue speech therapy exercises. Discussed working with family, repeating their sentences. Could also repeat sentences on TV. -Continue Plavix 75 mg daily -Continue Crestor 40 mg daily and Zetia 10 mg daily -Stroke warning discussed  Since their last visit: Patient feels he is doing okay. He will likely be retiring this year. His speech issues are noticeable to him  when he is talking too fast. He states no one else states they notice.  He continues to take Plavix 75 mg daily, Crestor 40 mg daily, and Zetia 10 mg daily.  He is working with his PCP on his DM. His latest HbA1c per patient was 8.2 (which I do not have these results).   He denies any new neurologic issues.  MEDICATIONS:  Outpatient Encounter Medications as of 04/29/2024  Medication Sig   bisoprolol  (ZEBETA ) 10 MG tablet Take 10 mg by mouth daily.   bisoprolol  (ZEBETA ) 10 MG tablet Take  1 tablet (10 mg total) by mouth daily.   cholecalciferol (VITAMIN D3) 25 MCG (1000 UNIT) tablet Take 1,000 Units by mouth daily.   clopidogrel (PLAVIX) 75 MG tablet Take 75 mg by mouth daily.   colchicine 0.6 MG tablet Take 0.6 mg by mouth 2 (two) times daily as needed.   ezetimibe (ZETIA) 10 MG tablet Take 10 mg by mouth daily.   fluticasone (FLONASE) 50 MCG/ACT nasal spray Place 2 sprays into the nose daily.   Menaquinone-7 (K2 PO) Take 1 tablet by mouth daily at 6 (six) AM.   Multiple Vitamin (MULTIVITAMIN) tablet Take 1 tablet by mouth daily.   rosuvastatin (CRESTOR) 40 MG tablet Take 40 mg by mouth daily.   valsartan  (DIOVAN ) 160 MG tablet Take 160 mg by mouth daily.   valsartan  (DIOVAN ) 160 MG tablet Take 1 tablet (160 mg total) by mouth daily.   valsartan  (DIOVAN ) 160 MG tablet Take 1 tablet (160 mg total) by mouth daily.   semaglutide -weight management (WEGOVY ) 0.25 MG/0.5ML SOAJ SQ injection Inject 0.25 mg into the skin once a week. (Patient not taking: Reported on 04/29/2024)   No facility-administered encounter medications on file as of 04/29/2024.    PAST MEDICAL HISTORY: Past Medical History:  Diagnosis Date   AAA (abdominal aortic aneurysm)    Allergy    Carotid artery occlusion    Chronic kidney disease    Degenerative joint disease    Diverticulosis    Hx of adenomatous colonic polyps 10/29/2015   Hyperlipidemia    Hypertension    Stroke (HCC) 03/09/2023   Ischemic stroke affecting the circle of willis    PAST SURGICAL HISTORY: Past Surgical History:  Procedure Laterality Date   carotid surgery Left 03/13/2023   Angio seal vascular device placement Left Carotid artery   CATARACT EXTRACTION Bilateral 04/15/1999   FINGER SURGERY Bilateral 04/15/1963   after traumatic injury   TONSILLECTOMY  04/15/1955    ALLERGIES: Allergies[1]  FAMILY HISTORY: Family History  Family history unknown: Yes    SOCIAL HISTORY: Social History[2] Social History   Social  History Narrative   Are you right handed or left handed? Right   Are you currently employed ?    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   Who lives with you? wife   What type of home do you live in: 1 story or 2 story? one    Caffiene 3 cups a day to 2 a day      Objective:  Vital Signs:  BP 128/72   Pulse 68   Ht 5' 7 (1.702 m)   Wt 191 lb (86.6 kg)   SpO2 99%   BMI 29.91 kg/m   General: No acute distress.  Patient appears well-groomed.   Head:  Normocephalic/atraumatic Neck: supple Back: No paraspinal tenderness Heart: regular rate and rhythm Lungs: Clear to auscultation bilaterally. Vascular: No carotid  bruits.  Neurological Exam: Mental status: alert and oriented, speech fluent and not dysarthric, able to name, mild difficulty with repeating.  Cranial nerves: CN I: not tested CN II: pupils equal, round and reactive to light, visual fields intact CN III, IV, VI:  full range of motion, no nystagmus, no ptosis CN V: facial sensation intact. CN VII: upper and lower face symmetric CN VIII: hearing intact CN IX, X: uvula midline CN XI: sternocleidomastoid and trapezius muscles intact CN XII: tongue midline  Bulk & Tone: normal, no fasciculations. Motor:  muscle strength 5/5 throughout Deep Tendon Reflexes:  2+ throughout.   Sensation:  Light touch sensation intact. Finger to nose testing:  Without dysmetria.   Gait:  Normal station and stride.   Labs and Imaging review: New results: 12/28/23: CBC significant for WBC 11.9 BMP significant for glucose 194, BUN 24, Cr 1.24  Carotid ultrasound (03/22/24): Summary:  Right Carotid: Velocities in the right ICA are consistent with a 1-39%  stenosis.   Left Carotid: Patent distal CCA- distal ICA stent with no stenosis seen.   Vertebrals:  Bilateral vertebral arteries demonstrate antegrade flow.  Subclavians: Normal flow hemodynamics were seen in bilateral subclavian                arteries.    Previously reviewed results: 05/21/23 external labs: HbA1c: 6.9 CMP significant for glucose 169 Lipid panel: tChol 90, LDL 37, TG 106   External labs: Stroke Laboratory Studies Lab Results  Component Value Date  HGBA1C 6.1 (H) 03/09/2023  VITAMINB12 655 03/09/2023  FOLATE 11.9 03/09/2023  TSH 1.86 03/09/2023   Lab Results  Component Value Date  CHOL 193 03/13/2023  CHOL 210 (H) 03/09/2023   Lab Results  Component Value Date  HDL 32 (L) 03/13/2023  HDL 29 (L) 03/09/2023   Lab Results  Component Value Date  LDLCHOCALBLD 115 (H) 03/13/2023  LDLCHOCALBLD 03/09/2023  Comment:  CALCULATED LDL NOT AVAILABLE DUE TO TRIGLYCERIDE VALUE >400. ORDER LDL CHOLESTEROL, DIRECT (LAB102) IF DESIRED. Desirable: <100 mg/dL Above Desirable: 899-870 mg/dL Borderline High: 869-840 mg/dL High: 839-810 mg/dL Very High: >=809 mg/dL   Lab Results  Component Value Date  TRIG 232 (H) 03/13/2023  TRIG 414 (H) 03/09/2023    Carotid ultrasound (internal - 04/02/23): Summary:  Right Carotid: Velocities in the right ICA are consistent with a 1-39%  stenosis.   Left Carotid: Patent stent without evidence of stenosis.   Vertebrals:  Bilateral vertebral arteries demonstrate antegrade flow.  Subclavians: Normal flow hemodynamics were seen in bilateral subclavian arteries.    Imaging (external): FINDINGS:  PRECONTRAST HEAD CT:  There is no evidence of acute intracranial hemorrhage. There is no mass, significant mass effect, or abnormal extra-axial collection. The basal cisterns are patent.  The ventricles are normal in size.  The density of the dural venous sinuses is normal.  The skull base and calvarium are normal.  Scattered opacification of the paranasal sinuses.  CT PERFUSION:   Area of increased time to peak and MTT in the left parieto-occipital lobe with decreased blood flow and preserved blood volume. Rapid AI calculates a perfusion defect of 25 mL.   NECK  CTA:   The imaged aortic arch is normal. The origins of the brachiocephalic, bilateral common carotid, bilateral subclavian arteries demonstrate no significant stenosis. Atherosclerosis of the origins of the bilateral vertebral arteries.  Normal bilateral common carotid arteries.  Atherosclerotic disease of the right carotid bifurcation and the proximal right internal carotid artery without significant stenosis by NASCET-like  criteria.   Atherosclerotic disease of the left carotid bifurcation and the proximal left internal carotid artery without significant stenosis by NASCET-like criteria.   Scattered atherosclerotic disease of the bilateral cervical vertebral arteries without significant stenosis. Right dominant vertebral artery.  Shotty appearing neck lymph nodes.  Calcified left upper lobe granulomas. Nonspecific diffuse groundglass densities with areas of bronchiectasis.  Multilevel degenerative changes of the cervical spine without evidence of aggressive osseous lesion.  HEAD CTA:  Atherosclerotic disease of the cavernous, and supraclinoid segments of the bilateral internal carotid arteries (ICAs) without significant stenosis. The petrous segment is also without significant stenosis.   Occlusion of the distal left M3 branch (series 11 image 140). Normal vertebral, basilar, and posterior cerebral arteries.  No saccular aneurysm.   Impression  IMPRESSION:   CT Brain: No acute intracranial process. Specifically, no evidence of acute hemorrhage.  CT Perfusion: Area of small mismatch perfusion in the left parieto-occipital lobe. Rapid AI calculates a mismatch volume of 25 mL.   CTA Head: Focal occlusion of the distal left M3 artery which correlates with area of mismatch perfusion defect in the left parieto-occipital lobe.  CTA Neck: No significant arterial stenosis.  CRITICAL RESULTS: New or unsuspected acute brain infarct  Venetia Sprague MD became aware of the  critical finding at 03/09/2023 6:05 PM. Key results discussed with Dr. Gwenn at 03/09/2023 6:17 PM.   ADDITIONAL ATTENDING COMMENTS: Agree. With regards to the proximal left cervical ICA atherosclerotic disease, there is mixed calcified and noncalcified atheroma which results in a approximately 56% stenosis per NASCET-like criteria. It should be noted that the prominent hypodense atheroma has an irregular luminal border (for example axial CTA series 11 image 338) indicating instability and likely the embolic source of the current left M3 occlusion.  Dictated by: Venetia Sprague, MD. 03/09/2023 6:21 PM  I, Camellia Buddle, DO, have reviewed the study and agree with the findings in this report. 03/09/2023 7:07 PM  CT Head Wo Contrast  Narrative  EXAMINATION: CT HEAD WO CONTRAST 03/10/2023 6:00 PM  ACCESSION NUMBER: [id number]  INDICATION: Post TNK. 24 HR post TNK, 24 HR post TNK.  COMPARISON: CT brain attack 03/09/2023  TECHNIQUE: Thinly collimated spiral CT images of the brain without contrast with axial, coronal and sagittal reformations.   RADIMETRICS DOSE: Scanner: [id number] CTDIvol: 38.2 DLP: 743 mGy-cm.  FINDINGS:  Developing hypoattenuating area in the left parietal occipital lobe corresponding with mismatch perfusion on recent brain attack. The basal cisterns are patent.  The ventricles are normal in size.  The density of the dural venous sinuses is normal.  The skull base and calvarium are normal.  Scattered mucosal thickening of the paranasal sinuses. The visualized orbits are unremarkable.  Impression  IMPRESSION:   Hypoattenuating area consistent with acute infarct in the left parietal occipital lobe corresponding with perfusion abnormality on brain attack imaging. No evidence of intracranial hemorrhage.  Dictated by: Arvella Arts, MD. 03/10/2023 6:06 PM  I, Hadassah Loel Dimitri, MD, have reviewed the study and agree with the findings in this report.  03/11/2023 3:00 PM  NES Embolization Cranial/Spinal  Addendum: 03/15/2023  Please note, left vert does not come off the arch. This was reported in error.    Narrative  INDICATION: 60 oy M presented 11/25 with acute onset of difficulty getting  words out. CT Batt with L M3 occlusion and L ICA stenosis. Nsgy consulted  for possible L ICA stent. Patient with aphasia. While stenosis was only  55%, felt to be irregular plaque  and potential source of embolic stroke.   COMPARISON: CTA  PROCEDURE: Stenting of left internal carotid artery with embolic  protection.   ATTENDING: Dr. Kicielinski, the attending and teaching physician, was  present and participated in the procedure.  ASSISTANTS: Delwin Romp  ANESTHESIA: Conscious Sedation (by General Anesthesia)   TRANSFEMORAL TECHNIQUE: After the risks, benefits, treatment options and  complications were discussed with the patient and family and they agreed  to proceed, the patient was brought to the angiography suite. Time out was  performed. The right groin region was prepped and draped in the usual  sterile fashion.Ultrasound guidance was used to select a site for access  in the right common femoral artery. The skin at the entry site was  anesthetized using 10cc of lidocaine. The micropuncture kit was used and a  5 french sheath was placed over a 0.035 PTFE floppy wire in the right  common femoral artery using Seldinger technique with imaging of the common  iliac artery confirming sheath placement. Utrasound guidance and  micropuncture technique were utilized for access.   Patient received a bolus of 5000 units of heparin. Baseline and serial  ACTs were obtained and the patient was redosed as necessary to maintain a  level between 200 and 250.   A Neuron Max, and a Bernie insert in conjunction with a 0.038 angled  guidewire were used to selectively catheterize the right common carotid  artery, left vertebral artery, left common  carotid artery (sim insert for  LCCA). After each vessel was selected multiple AP, lateral, oblique and  magnified angiographic runs were performed with filming over the head and  neck.   EMBOLIZATION: A Neuron Max, 071 catheter with a Sim insert in conjunction with a 0.038  angled guidewire were used to selectively catheterize the left common  carotid artery at which point the guide was advanced just proximal to the  bifurcation. The insert and guidewire were removed and the guide connected  to continuous heparinized flush as well as the power injector. Hand  injected angiography was performed for a control head run. We next  navigated a Synchro Select through a Spider FX rapid exchange system to  the petrous segment of the internal carotid where we deployed the 4mm  SpiderFX. Next we navigated a 5x79mm Precise stent across the stenotic  segment of the internal carotid artery. The stent was deployed under live  fluoroscopic visualization without complication. Hand injected angiography  performed which demonstrated appropriate stent placement without  complication. The SpiderFx was then recaptured. Angiography was again  performed which demonstrated appropriate deployment of the stent, good  wall apposition with no evidence of in stent stenosis or thrombosis. Head  run was performed showing distal vessels filled and cleared with same rate  as prior to embolization of the aneurysm without evidence of complication.  All catheters were removed. Hemostasis was achieved with Angioseal.    IMPLANTS:  PRECISE PRO RX 5 mm x 40 mm.    FINDINGS: RIGHT FEMORAL ARTERY: There is no evidence of iatrogenic injury. Access  site is above the bifurcation and appropriate for closure device.   RIGHT COMMON CAROTID ARTERY: The carotid artery bifurcation is at the C3/4  level. The carotid bifurcation is widely patent without atherosclerotic  disease or hemodynamic significant stenosis. The cervical  internal and  external carotid arteries are of normal course and caliber.  RIGHT COMMON CAROTID ARTERY:There are normal arterial, capillary and  venous phases. The posterior communicating artery is small, fills the  posterior cerebral artery and rapidly clears from competitive flow. The  contralateral anterior cerebral artery flash fills via the anterior  communicating artery. No evidence of vasculitis, aneurysm, branch block  or AV shunting is seen. There are no areas with absent capillary blush.  Major venous sinuses are patent.  LEFT VERTEBRAL ARTERY: There are normal arterial, capillary and venous  phases. Bilateral posterior communicating arteries fill and rapidly clear  from competitive flow. The right distal vertebral artery does not  backfill. No evidence of vasculitis, aneurysm, branch block or AV shunting  is seen. There are no areas with absent capillary blush. Major venous  sinuses are patent. Left vertebral artery arises directly from the aortic  arch  LEFT COMMON CAROTID ARTERY: The carotid artery bifurcation is at the C3/4  level. The carotid bifurcation is patent but the origin of the cervical  internal carotid artery is very stenotic with significant plaque. The  cervical internal and external carotid arteries are of normal course  otherwise.  LEFT COMMON CAROTID ARTERY (PRE-STENTING): Internal carotid artery was  visualized via hand injection at the level of the common. There is severe  stenosis at the origin secondary to a thrombus. There are normal arterial,  capillary and venous phases. The posterior communicating artery is small,  fills the posterior cerebral artery and rapidly clears from competitive  flow. The contralateral anterior cerebral artery flash fills via the  anterior communicating artery. No evidence of vasculitis, aneurysm,  branch block or AV shunting is seen. There are no areas with absent  capillary blush. Major venous sinuses are  patent.  LEFT COMMON CAROTID ARTERY: Interval advancement of guide catheter to the  common carotid artery bifurcation. Hand injected angiography demonstrates  no evidence of iatrogenic injury. Control run of the head unchanged from  prior imaging.   LEFT COMMON CAROTID ARTERY (POST STENTING): Interval placement of  anti-embolic device into the petrous segment of the internal carotid  artery and stenting of the carotid bifurcation. Angiography was again  performed which demonstrated appropriate deployment of the stent, good  wall apposition with no evidence of in stent stenosis or thrombosis  LEFT COMMON CAROTID ARTERY (CONTROL RUN): Interval removal of anti-embolic  device. Control head run through the guide without evidence of  complication as compared to prior run.    Impression  :  Successful stenting of the left internal carotid artery without  complication.   ASA plavix responder  L vert arises directly from arch  CTA head and neck (06/12/23): IMPRESSION: *No acute intracranial abnormality. *Chronic appearing posterior left MCA territory infarct. *No hemodynamically significant stenosis of the carotid arteries. *Moderate narrowing at origins of vertebral arteries bilaterally. *No hemodynamically significant intracranial stenosis.    Assessment/Plan:  This is Adam Shepherd, a 79 y.o. male with left parieto-occipital lobe stroke s/p TNK and LICA stent in 02/2023. 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 In terms of stroke recovery, patient has done really well, finishing PT/OT and speech therapy and has only very mild word finding difficulty, particularly repeating or when attempting to speak quickly without other clear deficits.   Plan: -Continue Plavix 75 mg daily -Continue Crestor 40 mg daily and zetia 10 mg daily -Stroke warning signs discussed  Return to clinic as needed   Venetia Potters, MD     [1]  Allergies Allergen Reactions    Metoprolol Tartrate     Other Reaction(s): bad dreams, cough  [2]  Social History Tobacco Use   Smoking  status: Former    Current packs/day: 0.00    Types: Cigarettes    Quit date: 11/07/1966    Years since quitting: 57.5   Smokeless tobacco: Never  Vaping Use   Vaping status: Never Used  Substance Use Topics   Alcohol use: No    Alcohol/week: 0.0 standard drinks of alcohol   Drug use: No   "

## 2024-04-22 ENCOUNTER — Encounter (HOSPITAL_COMMUNITY): Payer: Self-pay

## 2024-04-22 ENCOUNTER — Other Ambulatory Visit (HOSPITAL_COMMUNITY): Payer: Self-pay

## 2024-04-29 ENCOUNTER — Other Ambulatory Visit (HOSPITAL_COMMUNITY): Payer: Self-pay

## 2024-04-29 ENCOUNTER — Ambulatory Visit: Admitting: Neurology

## 2024-04-29 ENCOUNTER — Encounter (HOSPITAL_COMMUNITY): Payer: Self-pay | Admitting: Pharmacist

## 2024-04-29 ENCOUNTER — Encounter: Payer: Self-pay | Admitting: Neurology

## 2024-04-29 VITALS — BP 128/72 | HR 68 | Ht 67.0 in | Wt 191.0 lb

## 2024-04-29 DIAGNOSIS — I6522 Occlusion and stenosis of left carotid artery: Secondary | ICD-10-CM

## 2024-04-29 DIAGNOSIS — I63232 Cerebral infarction due to unspecified occlusion or stenosis of left carotid arteries: Secondary | ICD-10-CM

## 2024-04-29 MED ORDER — MOUNJARO 2.5 MG/0.5ML ~~LOC~~ SOAJ
2.5000 mg | SUBCUTANEOUS | 0 refills | Status: AC
  Filled 2024-04-29 – 2024-05-01 (×2): qty 2, 28d supply, fill #0

## 2024-04-29 NOTE — Patient Instructions (Signed)
 -Continue Plavix 75 mg daily -Continue Crestor 40 mg daily and zetia 10 mg daily  You can follow up with me as needed.  If you have new difficulty speaking, face droop, numbness on one side of the body, weakness on one side of the body, or dizziness/imbalance, this could be the sign of a stroke. Don't wait, please call EMS and be evaluated at the nearest emergency room.  The physicians and staff at [facility] are committed to providing excellent care. You may receive a survey requesting feedback about your experience at our office. We strive to receive very good responses to the survey questions. If you feel that your experience would prevent you from giving the office a very good  response, please contact our office to try to remedy the situation. We may be reached at [phone]. Thank you for taking the time out of your busy day to complete the survey.  Venetia Potters, MD [facility]  Preventing Falls at [facility] are common, often dreaded events in the lives of older people. Aside from the obvious injuries and even death that may result, fall can cause wide-ranging consequences including loss of independence, mental decline, decreased activity and mobility. Younger people are also at risk of falling, especially those with chronic illnesses and fatigue.  Ways to reduce risk for falling Examine diet and medications. Warm foods and alcohol dilate blood vessels, which can lead to dizziness when standing. Sleep aids, antidepressants and pain medications can also increase the likelihood of a fall.  Get a vision exam. Poor vision, cataracts and glaucoma increase the chances of falling.  Check foot gear. Shoes should fit snugly and have a sturdy, nonskid sole and a broad, low heel  Participate in a physician-approved exercise program to build and maintain muscle strength and improve balance and coordination. Programs that use ankle weights or stretch bands are excellent for  muscle-strengthening. Water aerobics programs and low-impact Tai Chi programs have also been shown to improve balance and coordination.  Increase vitamin D intake. Vitamin D improves muscle strength and increases the amount of calcium the body is able to absorb and deposit in bones.  How to prevent falls from common hazards Floors - Remove all loose wires, cords, and throw rugs. Minimize clutter. Make sure rugs are anchored and smooth. Keep furniture in its usual place.  Chairs -- Use chairs with straight backs, armrests and firm seats. Add firm cushions to existing pieces to add height.  Bathroom - Install grab bars and non-skid tape in the tub or shower. Use a bathtub transfer bench or a shower chair with a back support Use an elevated toilet seat and/or safety rails to assist standing from a low surface. Do not use towel racks or bathroom tissue holders to help you stand.  Lighting - Make sure halls, stairways, and entrances are well-lit. Install a night light in your bathroom or hallway. Make sure there is a light switch at the top and bottom of the staircase. Turn lights on if you get up in the middle of the night. Make sure lamps or light switches are within reach of the bed if you have to get up during the night.  Kitchen - Install non-skid rubber mats near the sink and stove. Clean spills immediately. Store frequently used utensils, pots, pans between waist and eye level. This helps prevent reaching and bending. Sit when getting things out of lower cupboards.  Living room/ Bedrooms - Place furniture with wide spaces in between, giving  enough room to move around. Establish a route through the living room that gives you something to hold onto as you walk.  Stairs - Make sure treads, rails, and rugs are secure. Install a rail on both sides of the stairs. If stairs are a threat, it might be helpful to arrange most of your activities on the lower level to reduce the number of times you must climb  the stairs.  Entrances and doorways - Install metal handles on the walls adjacent to the doorknobs of all doors to make it more secure as you travel through the doorway.  Tips for maintaining balance Keep at least one hand free at all times. Try using a backpack or fanny pack to hold things rather than carrying them in your hands. Never carry objects in both hands when walking as this interferes with keeping your balance.  Attempt to swing both arms from front to back while walking. This might require a conscious effort if Parkinson's disease has diminished your movement. It will, however, help you to maintain balance and posture, and reduce fatigue.  Consciously lift your feet off of the ground when walking. Shuffling and dragging of the feet is a common culprit in losing your balance.  When trying to navigate turns, use a U technique of facing forward and making a wide turn, rather than pivoting sharply.  Try to stand with your feet shoulder-length apart. When your feet are close together for any length of time, you increase your risk of losing your balance and falling.  Do one thing at a time. Don't try to walk and accomplish another task, such as reading or looking around. The decrease in your automatic reflexes complicates motor function, so the less distraction, the better.  Do not wear rubber or gripping soled shoes, they might catch on the floor and cause tripping.  Move slowly when changing positions. Use deliberate, concentrated movements and, if needed, use a grab bar or walking aid. Count 15 seconds between each movement. For example, when rising from a seated position, wait 15 seconds after standing to begin walking.  If balance is a continuous problem, you might want to consider a walking aid such as a cane, walking stick, or walker. Once you've mastered walking with help, you might be ready to try it on your own again.

## 2024-05-01 ENCOUNTER — Other Ambulatory Visit: Payer: Self-pay

## 2024-05-02 ENCOUNTER — Other Ambulatory Visit (HOSPITAL_COMMUNITY): Payer: Self-pay

## 2024-05-02 ENCOUNTER — Encounter (HOSPITAL_COMMUNITY): Payer: Self-pay

## 2024-05-02 NOTE — Addendum Note (Signed)
 Addended by: LEIGH VENETIA CROME on: 05/02/2024 09:07 AM   Modules accepted: Level of Service

## 2024-05-07 ENCOUNTER — Other Ambulatory Visit (HOSPITAL_COMMUNITY): Payer: Self-pay

## 2024-05-10 ENCOUNTER — Other Ambulatory Visit (HOSPITAL_COMMUNITY): Payer: Self-pay

## 2024-05-25 ENCOUNTER — Encounter (HOSPITAL_COMMUNITY)

## 2024-09-20 ENCOUNTER — Ambulatory Visit: Admitting: Vascular Surgery

## 2024-09-20 ENCOUNTER — Ambulatory Visit (HOSPITAL_COMMUNITY)
